# Patient Record
Sex: Female | Born: 1955 | Hispanic: No | Smoking: Never smoker
Health system: Southern US, Community
[De-identification: ages and names within clinical notes are randomized; demographics above are authoritative.]

## PROBLEM LIST (undated history)

## (undated) DIAGNOSIS — I10 Essential (primary) hypertension: Secondary | ICD-10-CM

## (undated) DIAGNOSIS — E119 Type 2 diabetes mellitus without complications: Secondary | ICD-10-CM

## (undated) HISTORY — DX: Essential (primary) hypertension: I10

## (undated) HISTORY — DX: Type 2 diabetes mellitus without complications: E11.9

## (undated) HISTORY — PX: NO PAST SURGERIES: SHX2092

---

## 1999-08-31 ENCOUNTER — Encounter: Admission: RE | Admit: 1999-08-31 | Discharge: 1999-11-29 | Payer: Self-pay | Admitting: *Deleted

## 2013-02-28 DIAGNOSIS — E119 Type 2 diabetes mellitus without complications: Secondary | ICD-10-CM | POA: Insufficient documentation

## 2013-07-31 DIAGNOSIS — R2230 Localized swelling, mass and lump, unspecified upper limb: Secondary | ICD-10-CM | POA: Insufficient documentation

## 2014-05-15 LAB — HM DIABETES EYE EXAM

## 2014-06-12 LAB — HEMOGLOBIN A1C: Hgb A1c MFr Bld: 9.7 % — AB (ref 4.0–6.0)

## 2014-08-26 ENCOUNTER — Ambulatory Visit (INDEPENDENT_AMBULATORY_CARE_PROVIDER_SITE_OTHER): Payer: BLUE CROSS/BLUE SHIELD | Admitting: Family Medicine

## 2014-08-26 VITALS — BP 151/70 | HR 73 | Ht 60.5 in | Wt 130.0 lb

## 2014-08-26 DIAGNOSIS — E119 Type 2 diabetes mellitus without complications: Secondary | ICD-10-CM

## 2014-08-26 DIAGNOSIS — I10 Essential (primary) hypertension: Secondary | ICD-10-CM | POA: Insufficient documentation

## 2014-08-26 DIAGNOSIS — Z0000 Encounter for general adult medical examination without abnormal findings: Secondary | ICD-10-CM

## 2014-08-26 LAB — COMPLETE METABOLIC PANEL WITH GFR
ALK PHOS: 72 U/L (ref 39–117)
ALT: 22 U/L (ref 0–35)
AST: 17 U/L (ref 0–37)
Albumin: 4.7 g/dL (ref 3.5–5.2)
BILIRUBIN TOTAL: 0.7 mg/dL (ref 0.2–1.2)
BUN: 12 mg/dL (ref 6–23)
CHLORIDE: 102 meq/L (ref 96–112)
CO2: 24 mEq/L (ref 19–32)
Calcium: 9.4 mg/dL (ref 8.4–10.5)
Creat: 0.55 mg/dL (ref 0.50–1.10)
GFR, Est African American: >89 mL/min
GFR, Est Non African American: >89 mL/min
GLUCOSE: 202 mg/dL — AB (ref 70–99)
Potassium: 4.5 mEq/L (ref 3.5–5.3)
Sodium: 138 mEq/L (ref 135–145)
Total Protein: 7.8 g/dL (ref 6.0–8.3)

## 2014-08-26 LAB — LIPID PANEL
Cholesterol: 193 mg/dL (ref 0–200)
HDL: 47 mg/dL (ref >39)
LDL CALC: 97 mg/dL (ref 0–99)
Total CHOL/HDL Ratio: 4.1 Ratio
Triglycerides: 244 mg/dL — ABNORMAL HIGH (ref <150)
VLDL: 49 mg/dL — ABNORMAL HIGH (ref 0–40)

## 2014-08-26 LAB — POCT GLYCOSYLATED HEMOGLOBIN (HGB A1C): Hemoglobin A1C: 10

## 2014-08-26 MED ORDER — DAPAGLIFLOZIN PRO-METFORMIN ER 5-1000 MG PO TB24: 1.0000 | ORAL_TABLET | Freq: Every day | ORAL | Status: DC

## 2014-08-26 MED ORDER — LOSARTAN POTASSIUM 50 MG PO TABS: 50.0000 mg | ORAL_TABLET | Freq: Every day | ORAL | Status: DC

## 2014-08-26 NOTE — Patient Instructions (Addendum)
Increase the metformin to twice a day. When you run out then can start the New pill for diabetes.

## 2014-08-26 NOTE — Progress Notes (Signed)
Subjective:    Patient ID: Cindy Parrish, female    DOB: 12-Mar-1956, 59 y.o.   MRN: [id number]  HPI 59 year old Hispanic female who is here with an interpreter today he comes in to establish care. She has a history of diabetes and hypertension. She normally takes metformin 1000 mg daily. She did not take any medications today but she is fasting. She really doesn't take her blood sugars on her glucometer because she has difficulty with using it. She did not bring it in with her here today. She also has a history of high blood pressure [employer] chlorothiazide for this. She was on lisinopril for a short period time and says that it caused vision changes. She stopped it and then restarted it again and it did the same thing so she feels very confident that it was causing the side effects. She does still take 12.5 g of hydrochlorothiazide currently. The interpreter was here with her today. She also has been eating more okra and celery and nopal and wants to make sure it's okay to eat these things.  She reports her last colonoscopy was in 2015 and last mammogram was in 2015. Last Pap smear was in 2014.   Review of Systems  Constitutional: Negative for fever, diaphoresis and unexpected weight change.  HENT: Negative for hearing loss, rhinorrhea and tinnitus.   Eyes: Negative for visual disturbance.  Respiratory: Negative for cough and wheezing.   Cardiovascular: Negative for chest pain and palpitations.  Gastrointestinal: Negative for nausea, vomiting, diarrhea and blood in stool.  Genitourinary: Negative for vaginal bleeding, vaginal discharge and difficulty urinating.  Musculoskeletal: Negative for myalgias and arthralgias.  Skin: Negative for rash.  Neurological: Negative for headaches.  Hematological: Negative for adenopathy. Does not bruise/bleed easily.  Psychiatric/Behavioral: Negative for sleep disturbance and dysphoric mood. The patient is not nervous/anxious.    BP 151/70 mmHg   Pulse 73  Ht 5' 0.5" (1.537 m)  Wt 130 lb (58.968 kg)  BMI 24.96 kg/m2  SpO2 98%    Allergies  Allergen Reactions  . Penicillins Rash    Only in IM form  . Lisinopril Other (See Comments)    vision changes    No past medical history on file.  Past Surgical History  Procedure Laterality Date  . No past surgeries      History   Social History  . Marital Status: Divorced    Spouse Name: N/A  . Number of Children: N/A  . Years of Education: N/A   Occupational History  . Not on file.   Social History Main Topics  . Smoking status: Never Smoker   . Smokeless tobacco: Not on file  . Alcohol Use: No  . Drug Use: No  . Sexual Activity: Not Currently   Other Topics Concern  . Not on file   Social History Narrative   2 coffees per day.  Walks for 1/2 hour 4 days per week.     Family History  Problem Relation Age of Onset  . Diabetes Mother     Outpatient Encounter Prescriptions as of 08/26/2014  Medication Sig  . Dapagliflozin-Metformin HCl ER 11-998 MG TB24 Take 1 tablet by mouth daily.  . hydrochlorothiazide (MICROZIDE) 12.5 MG capsule Take 1 capsule by mouth daily.  Marland Kitchen. losartan (COZAAR) 50 MG tablet Take 1 tablet (50 mg total) by mouth daily.  . [DISCONTINUED] metFORMIN (GLUCOPHAGE) 1000 MG tablet Take 1 tablet by mouth daily. Take 1 tablet by mouth every day with meals  Objective:   Physical Exam  Constitutional: She is oriented to person, place, and time. She appears well-developed and well-nourished.  HENT:  Head: Normocephalic and atraumatic.  Eyes: Conjunctivae are normal. Pupils are equal, round, and reactive to light.  Neck: Neck supple. No thyromegaly present.  Cardiovascular: Normal rate, regular rhythm and normal heart sounds.   No carotid brutis.   Pulmonary/Chest: Effort normal and breath sounds normal.  Lymphadenopathy:    She has no cervical adenopathy.  Neurological: She is alert and oriented to person, place, and time.  Skin:  Skin is warm and dry.  Psychiatric: She has a normal mood and affect. Her behavior is normal.          Assessment & Plan:  DM- Uncontrolled.  A1C is 10.  Discussed options. Will stop metformin and do Xigduo instead which has metformin artery and it. One about potential side effects including increased risk for UTIs and used infections. If she has any problems before her next one and please call the office back and let us know. Otherwise I think she'll do well with it. Also discussed the increased urination that occurs especially at the beginning of taking it. Will try Lisinpril caused vision changes.  Will try another ACE.  We can also consider insulin at some point bad like to start with something that may help her lose a little bit of weight. Follow-up in 3 months.  HTN - Uncontrolled.  Will add benazepril.  Continue hydrochlorothiazide. Follow up in 3 months.  She is hoping to get her physical scheduled since I would like to go ahead and get a lab slip for blood work. This was provided today and encouraged her to go fasting.

## 2014-08-27 LAB — TSH: TSH: 1.644 u[IU]/mL (ref 0.350–4.500)

## 2014-08-28 ENCOUNTER — Encounter: Payer: Self-pay | Admitting: Family Medicine

## 2014-09-20 ENCOUNTER — Telehealth: Payer: Self-pay

## 2014-09-20 NOTE — Telephone Encounter (Signed)
Follow up note from last lab results. Clydene's daughter, Gabriel RainwaterJaneth, states the losartan is causing her to have dizziness. She is ok with switching to a different ARB.

## 2014-09-23 ENCOUNTER — Other Ambulatory Visit: Payer: Self-pay

## 2014-09-23 MED ORDER — VALSARTAN 160 MG PO TABS: 160.0000 mg | ORAL_TABLET | Freq: Every day | ORAL | Status: DC

## 2014-09-23 MED ORDER — DAPAGLIFLOZIN PRO-METFORMIN ER 5-1000 MG PO TB24: 5.0000 mg | ORAL_TABLET | Freq: Once | ORAL | Status: DC

## 2014-09-23 NOTE — Telephone Encounter (Signed)
Ne script sent for valsartan to [facility].  Try for at least 3 weeks adn see how she dos on it.

## 2014-09-23 NOTE — Telephone Encounter (Signed)
Called Cindy HesselbachMaria to inform her of new script sent to pharmacy. - CF

## 2014-10-01 ENCOUNTER — Encounter: Payer: Self-pay | Admitting: Family Medicine

## 2014-11-25 ENCOUNTER — Ambulatory Visit: Payer: BLUE CROSS/BLUE SHIELD | Admitting: Family Medicine

## 2014-11-28 ENCOUNTER — Telehealth: Payer: Self-pay

## 2014-11-28 MED ORDER — METFORMIN HCL 1000 MG PO TABS: 1000.0000 mg | ORAL_TABLET | Freq: Two times a day (BID) | ORAL | Status: DC

## 2014-11-28 MED ORDER — EMPAGLIFLOZIN 25 MG PO TABS: 25.0000 mg | ORAL_TABLET | Freq: Every day | ORAL | Status: DC

## 2014-11-28 NOTE — Telephone Encounter (Signed)
Left detailed message.   

## 2014-11-28 NOTE — Telephone Encounter (Signed)
Patient cannot take the Marcelline DeistFarxiga because it upsets her stomach. She would like Metformin only. Please advise.

## 2014-11-28 NOTE — Telephone Encounter (Signed)
Metformin only will not control her blood sugars. She will have to take another medication. It certainly does not have to be [place]xiga if that's causing some side effects but we have to choose something.  We can try Jardiance with th metformin.

## 2014-11-29 NOTE — Telephone Encounter (Signed)
Daughter advised.

## 2014-12-10 ENCOUNTER — Ambulatory Visit: Payer: BLUE CROSS/BLUE SHIELD | Admitting: Family Medicine

## 2014-12-20 ENCOUNTER — Ambulatory Visit (INDEPENDENT_AMBULATORY_CARE_PROVIDER_SITE_OTHER): Payer: BLUE CROSS/BLUE SHIELD | Admitting: Family Medicine

## 2014-12-20 ENCOUNTER — Encounter: Payer: Self-pay | Admitting: Family Medicine

## 2014-12-20 VITALS — BP 140/80 | HR 75 | Ht 61.0 in | Wt 129.0 lb

## 2014-12-20 DIAGNOSIS — E119 Type 2 diabetes mellitus without complications: Secondary | ICD-10-CM

## 2014-12-20 DIAGNOSIS — I10 Essential (primary) hypertension: Secondary | ICD-10-CM | POA: Diagnosis not present

## 2014-12-20 DIAGNOSIS — E781 Pure hyperglyceridemia: Secondary | ICD-10-CM

## 2014-12-20 LAB — POCT UA - MICROALBUMIN
Albumin/Creatinine Ratio, Urine, POC: <30
Creatinine, POC: 100 mg/dL
MICROALBUMIN (UR) POC: 10 mg/L

## 2014-12-20 LAB — POCT GLYCOSYLATED HEMOGLOBIN (HGB A1C): Hemoglobin A1C: 8.4

## 2014-12-20 MED ORDER — METOPROLOL SUCCINATE ER 25 MG PO TB24: 25.0000 mg | ORAL_TABLET | Freq: Every day | ORAL | Status: DC

## 2014-12-20 MED ORDER — HYDROCHLOROTHIAZIDE 12.5 MG PO CAPS: 12.5000 mg | ORAL_CAPSULE | Freq: Every day | ORAL | Status: DC

## 2014-12-20 MED ORDER — DAPAGLIFLOZIN PROPANEDIOL 5 MG PO TABS: 5.0000 mg | ORAL_TABLET | Freq: Every day | ORAL | Status: DC

## 2014-12-20 NOTE — Progress Notes (Signed)
   Subjective:    Patient ID: Cindy Parrish, female    DOB: 1956-05-15, 59 y.o.   MRN: [id number]  HPI Diabetes - no hypoglycemic events. No wounds or sores that are not healing well. No increased thirst or urination. Checking glucose at home. Taking medications as prescribed without any side effects. She stopped the jardiance about 20 days ago bc started burning her stomach immediately after taking it.   Hypertension- We added benazepril to her regimen 3 mo ago.  Pt denies chest pain, SOB, dizziness, or heart palpitations.  Taking meds as directed w/o problems.  Denies medication side effects.    Review of Systems     Objective:   Physical Exam  Constitutional: She is oriented to person, place, and time. She appears well-developed and well-nourished.  HENT:  Head: Normocephalic and atraumatic.  Cardiovascular: Normal rate, regular rhythm and normal heart sounds.   Pulmonary/Chest: Effort normal and breath sounds normal.  Neurological: She is alert and oriented to person, place, and time.  Skin: Skin is warm and dry.  Psychiatric: She has a normal mood and affect. Her behavior is normal.          Assessment & Plan:  Diabetes-uncontrolled, but improved. A1C today is 8.4.  She has tried walking regularly which helps and I think the park sego made a big difference in her blood sugars as well.. Last A1c 3 months ago was 10.0. Will change to jardiance, due to intolerance to for cecal. Added to her intolerance list..  Due for foot exam and urine micro. Patient reports eye exam is UTD. Will call for report.    Hypertension - will try metoprolol since doesn't seem to tolerate the ACE and ARB class - all cause blurry visoin.  Added to intolerance list. Will start metoprolol. F/U in 3 mo.    Hypertriglyceridemia-she really needs to be started on a statin especially with her diagnosis of diabetes. Because she has had multiple medication side effects I really want to find something that's going  to work for her diabetes first that she tolerates before adding a statin so it doesn't complicate which medication is causing which side effects.

## 2014-12-20 NOTE — Addendum Note (Signed)
Addended by: Nani GasserMETHENEY, CATHERINE D on: 12/20/2014 12:28 PM   Modules accepted: Orders

## 2014-12-26 ENCOUNTER — Encounter: Payer: Self-pay | Admitting: Family Medicine

## 2015-03-22 ENCOUNTER — Other Ambulatory Visit: Payer: Self-pay | Admitting: Family Medicine

## 2015-03-28 ENCOUNTER — Ambulatory Visit (INDEPENDENT_AMBULATORY_CARE_PROVIDER_SITE_OTHER): Payer: BLUE CROSS/BLUE SHIELD | Admitting: Family Medicine

## 2015-03-28 ENCOUNTER — Encounter: Payer: Self-pay | Admitting: Family Medicine

## 2015-03-28 VITALS — BP 159/71 | HR 70 | Ht 61.0 in | Wt 129.0 lb

## 2015-03-28 DIAGNOSIS — E119 Type 2 diabetes mellitus without complications: Secondary | ICD-10-CM | POA: Diagnosis not present

## 2015-03-28 DIAGNOSIS — I10 Essential (primary) hypertension: Secondary | ICD-10-CM | POA: Diagnosis not present

## 2015-03-28 DIAGNOSIS — Z1159 Encounter for screening for other viral diseases: Secondary | ICD-10-CM

## 2015-03-28 LAB — POCT GLYCOSYLATED HEMOGLOBIN (HGB A1C): HEMOGLOBIN A1C: 9.4

## 2015-03-28 MED ORDER — GLIPIZIDE ER 5 MG PO TB24: 5.0000 mg | ORAL_TABLET | Freq: Every day | ORAL | Status: DC

## 2015-03-28 NOTE — Progress Notes (Signed)
   Subjective:    Patient ID: Cindy Parrish, female    DOB: Aug 26, 1955, 59 y.o.   MRN: [id number]  HPI Hypertension- Pt denies chest pain, SOB, dizziness, or heart palpitations.  Taking meds as directed w/o problems.  Denies medication side effects.  Says she is not sure if she is taking HCTZ or the metoprolol.    Diabetes - no hypoglycemic events. No wounds or sores that are not healing well. No increased thirst or urination. Checking glucose at home. Taking medications as prescribed without any side effects.  She stopped the [place] because after 3 days it gave her a headache.  She stopped it so hasn't been taking it. She is taking the metformin.  She also hasn't been able to walk for exercise like she was previously as her car broke down and she's not able to get to the park that she usually walks that.  She was seen today with an interpreter.  Review of Systems     Objective:   Physical Exam  Constitutional: She is oriented to person, place, and time. She appears well-developed and well-nourished.  HENT:  Head: Normocephalic and atraumatic.  Cardiovascular: Normal rate, regular rhythm and normal heart sounds.   Pulmonary/Chest: Effort normal and breath sounds normal.  Neurological: She is alert and oriented to person, place, and time.  Skin: Skin is warm and dry.  Psychiatric: She has a normal mood and affect. Her behavior is normal.          Assessment & Plan:  HTN - uncontrolled but she actually didn't take her medication this morning. She says it upsets her stomach if she doesn't take it with food and she was trying to fast. She is also not sure she's actually taking the HCTZ so I asked her to verify this when she gets home and compare with her after visit summary checkout list.  DM - uncontrolled. A1C is now 9.4.  Discussed options. Will start glipizide. Monitor for lows.  Will start with  ER.  F/U in 3 months. May need ot increase dose or add insulin at next OV. If can  start walking atai that will help.   Discussed getting the flu shot.   Discussed need for pneumococcal 23 vaccine. She said she wants to think about is a handout provided in [place].  She did agree to hepatitis C screening.

## 2015-03-29 LAB — BASIC METABOLIC PANEL WITH GFR
BUN: 10 mg/dL (ref 7–25)
CHLORIDE: 98 mmol/L (ref 98–110)
CO2: 25 mmol/L (ref 20–31)
CREATININE: 0.57 mg/dL (ref 0.50–0.99)
Calcium: 9.3 mg/dL (ref 8.6–10.4)
GFR, Est African American: >89 mL/min (ref >=60)
GFR, Est Non African American: >89 mL/min (ref >=60)
GLUCOSE: 207 mg/dL — AB (ref 65–99)
POTASSIUM: 4.6 mmol/L (ref 3.5–5.3)
Sodium: 137 mmol/L (ref 135–146)

## 2015-03-29 LAB — HEPATITIS C ANTIBODY: HCV Ab: NEGATIVE

## 2015-06-23 ENCOUNTER — Other Ambulatory Visit: Payer: Self-pay | Admitting: Family Medicine

## 2015-06-23 DIAGNOSIS — Z1231 Encounter for screening mammogram for malignant neoplasm of breast: Secondary | ICD-10-CM

## 2015-06-27 ENCOUNTER — Encounter: Payer: Self-pay | Admitting: Family Medicine

## 2015-06-27 ENCOUNTER — Ambulatory Visit: Payer: BLUE CROSS/BLUE SHIELD | Admitting: Family Medicine

## 2015-06-27 ENCOUNTER — Ambulatory Visit (INDEPENDENT_AMBULATORY_CARE_PROVIDER_SITE_OTHER): Payer: BLUE CROSS/BLUE SHIELD | Admitting: Family Medicine

## 2015-06-27 ENCOUNTER — Other Ambulatory Visit (HOSPITAL_COMMUNITY)
Admission: RE | Admit: 2015-06-27 | Discharge: 2015-06-27 | Disposition: A | Discharge status: Home / Self Care | Payer: BLUE CROSS/BLUE SHIELD | Source: Ambulatory Visit | Attending: Family Medicine | Admitting: Family Medicine

## 2015-06-27 VITALS — BP 158/74 | HR 75 | Wt 128.0 lb

## 2015-06-27 DIAGNOSIS — M546 Pain in thoracic spine: Secondary | ICD-10-CM | POA: Diagnosis not present

## 2015-06-27 DIAGNOSIS — E119 Type 2 diabetes mellitus without complications: Secondary | ICD-10-CM | POA: Diagnosis not present

## 2015-06-27 DIAGNOSIS — M549 Dorsalgia, unspecified: Secondary | ICD-10-CM

## 2015-06-27 DIAGNOSIS — Z0000 Encounter for general adult medical examination without abnormal findings: Secondary | ICD-10-CM

## 2015-06-27 DIAGNOSIS — I10 Essential (primary) hypertension: Secondary | ICD-10-CM

## 2015-06-27 DIAGNOSIS — Z1151 Encounter for screening for human papillomavirus (HPV): Secondary | ICD-10-CM | POA: Diagnosis not present

## 2015-06-27 DIAGNOSIS — Z23 Encounter for immunization: Secondary | ICD-10-CM | POA: Diagnosis not present

## 2015-06-27 DIAGNOSIS — Z01419 Encounter for gynecological examination (general) (routine) without abnormal findings: Secondary | ICD-10-CM | POA: Insufficient documentation

## 2015-06-27 DIAGNOSIS — R1011 Right upper quadrant pain: Secondary | ICD-10-CM

## 2015-06-27 LAB — POCT GLYCOSYLATED HEMOGLOBIN (HGB A1C): Hemoglobin A1C: 10.5

## 2015-06-27 MED ORDER — METOPROLOL SUCCINATE ER 50 MG PO TB24: 50.0000 mg | ORAL_TABLET | Freq: Every day | ORAL | Status: DC

## 2015-06-27 NOTE — Progress Notes (Signed)
Subjective:     Cindy Parrish is a 59 y.o. female and is here for a comprehensive physical exam. The patient reports no problems.  Diabetes - no hypoglycemic events. No wounds or sores that are not healing well. No increased thirst or urination. Checking glucose at home. Taking medications as prescribed without any side effects.  She has not been walking.  She wants to try a cactus juice ad beet juice to lower her sugars   Hypertension- Pt denies chest pain, SOB, dizziness, or heart palpitations.  Stopped the medication for ahile bc thought it was causing body pain but restarted it yesterday.    chronic pain in her Upper on the right side near the scapula. She says it started right after she went through menopause. Says it keeps her from working.  She says it becomes so painful that it's hard to move her arm and work. Says when she tries her pain is more painful. She says she has a low level of pain constantly. She describes it as near her right shoulder blade, but it feels deep.  Worse with acitvity.  Better with rest. No old trauma or injury known. She's never had any additional workup such as x-rays etc.  She also has RUQ pain that started after menopause.  Says he feels weak when it happens.  No nausea or vomiting.  Pain usually lasts all day.  No constipation or diarrhea.  No fevers or chills or sweats. It doesn't seem to get worse with eating or not eating. It's always a low level of pain that's present but gets worse at times especially with activity. She did see a provider for it a couple of years ago and was told that it was benign. She's never had any other workup or imaging.   Social History   Social History  . Marital Status: Divorced    Spouse Name: N/A  . Number of Children: N/A  . Years of Education: N/A   Occupational History  . Not on file.   Social History Main Topics  . Smoking status: Never Smoker   . Smokeless tobacco: Not on file  . Alcohol Use: No  . Drug Use: No   . Sexual Activity: Not Currently   Other Topics Concern  . Not on file   Social History Narrative   2 coffees per day.  Walks for 1/2 hour 4 days per week.    Health Maintenance  Topic Date Due  . HIV Screening  03/05/1970  . TETANUS/TDAP  03/05/1974  . ZOSTAVAX  03/06/2015  . OPHTHALMOLOGY EXAM  05/16/2015  . INFLUENZA VACCINE  03/27/2016 (Originally 02/17/2015)  . PNEUMOCOCCAL POLYSACCHARIDE VACCINE (1) 03/27/2021 (Originally 03/05/1957)  . PAP SMEAR  07/20/2015  . HEMOGLOBIN A1C  09/25/2015  . FOOT EXAM  12/20/2015  . URINE MICROALBUMIN  12/20/2015  . MAMMOGRAM  07/10/2016  . COLONOSCOPY  07/20/2023  . Hepatitis C Screening  Completed    The following portions of the patient's history were reviewed and updated as appropriate: allergies, current medications, past family history, past medical history, past social history, past surgical history and problem list.  Review of Systems Pertinent items noted in HPI and remainder of comprehensive ROS otherwise negative.   Objective:    BP 158/74 mmHg  Pulse 75  Wt 128 lb (58.06 kg)  SpO2 99% General appearance: alert, cooperative and appears stated age Head: Normocephalic, without obvious abnormality, atraumatic Eyes: conj clear, EOMI, PEERLA Ears: normal TM's and external ear canals both  ears Nose: Nares normal. Septum midline. Mucosa normal. No drainage or sinus tenderness. Throat: lips, mucosa, and tongue normal; teeth and gums normal Neck: no adenopathy, no carotid bruit, no JVD, supple, symmetrical, trachea midline and thyroid not enlarged, symmetric, no tenderness/mass/nodules Back: symmetric, no curvature. ROM normal. No CVA tenderness. Lungs: clear to auscultation bilaterally Breasts: normal appearance, no masses or tenderness Heart: regular rate and rhythm, S1, S2 normal, no murmur, click, rub or gallop Abdomen: soft, non-tender; bowel sounds normal; no masses,  no organomegaly Pelvic: cervix normal in appearance,  external genitalia normal, no adnexal masses or tenderness, no cervical motion tenderness, rectovaginal septum normal, uterus normal size, shape, and consistency and vagina normal without discharge Extremities: extremities normal, atraumatic, no cyanosis or edema Pulses: 2+ and symmetric Skin: Skin color, texture, turgor normal. No rashes or lesions Lymph nodes: Cervical, supraclavicular, and axillary nodes normal. Neurologic: Alert and oriented X 3, normal strength and tone. Normal symmetric reflexes. Normal coordination and gait    Assessment:    Healthy female exam.      Plan:     See After Visit Summary for Counseling Recommendations   Keep up a regular exercise program and make sure you are eating a healthy diet Try to eat 4 servings of dairy a day, or if you are lactose intolerant take a calcium with vitamin D daily.  Your vaccines are up to date.   DM - Uncontrolled.  I strongly encouraged her to consider taking an additional medication with the metformin. She has tried several but unfortunately has had side effects. She really was resistant and wants to try a juice that is made from cactus plant as well as beats. She also admits she has not been exercising and wants to get back on track with that before we add another medication. I reviewed the long-term consequences of having uncontrolled blood sugars and the side effects down the road. She wants to try this for 3 months area explained that if at 3 months her blood sugars are still not well controlled then she needs to commit to trying another medication.. Intoleratnt to ACE.    Due for Tdap as well. Given today. Pneumococcal 23 vaccine given today as well.  Lab Results  Component Value Date   HGBA1C 10.5 06/27/2015    HTN - uncontrolled. She just restarted her blood pressure medication yesterday as she thought it was causing some body aches. But says she's continued to have the bodyaches even off of the medication so she  decided to restart it. Encouraged her to be consistent and hopefully her blood pressure will look more well controlled when I see her back. I did go ahead and increase the metoprolol to 50 mg as well and sent a new prescription to the pharmacy.  Chronic right upper back pain for several years-she's really never had a formal workup or evaluation. She had some mild tenderness along the anterior edge of the scapula. The feels like her pain is a little deeper. Offer to do x-rays. She wants to check with the insurance coverage first. It is significant enough that it makes it difficult for her to work.  Right upper quadrant pain-she has seen a physician for this before and was told her that it was benign. Her pain is persistent. She does not take pain medications for it. But this is been going on for several ears I think a CT of the abdomen and is warranted. Again she wants to check with the insurance first.  I think it or something very worrisome it would've gotten worse by now. But certainly do think it requires additional workup.

## 2015-06-27 NOTE — Patient Instructions (Addendum)
Keep up a regular exercise program and make sure you are eating a healthy diet Try to eat 4 servings of dairy a day, or if you are lactose intolerant take a calcium with vitamin D daily.  Your vaccines are up to date.    4 year right upper back pain I would recommend an x-ray of the thoracic spine as well as an x-ray of your right shoulder. For the right upper quadrant abdominal pain I would recommend CT of the abdomen with contrast for further evaluation since this is been going on for several years. I also recommend that you get an up-to-date yearly eye exam because of your diagnosis of diabetes.

## 2015-06-30 LAB — CYTOLOGY - PAP

## 2015-07-01 LAB — HM DIABETES EYE EXAM

## 2015-07-01 NOTE — Progress Notes (Signed)
Quick Note:  Call patient: Your Pap smear is normal. Repeat in 5 years. ______ 

## 2015-07-02 ENCOUNTER — Encounter: Payer: Self-pay | Admitting: Family Medicine

## 2015-07-16 ENCOUNTER — Ambulatory Visit (INDEPENDENT_AMBULATORY_CARE_PROVIDER_SITE_OTHER): Payer: BLUE CROSS/BLUE SHIELD

## 2015-07-16 DIAGNOSIS — Z1231 Encounter for screening mammogram for malignant neoplasm of breast: Secondary | ICD-10-CM | POA: Diagnosis not present

## 2015-07-31 ENCOUNTER — Ambulatory Visit (INDEPENDENT_AMBULATORY_CARE_PROVIDER_SITE_OTHER): Payer: BLUE CROSS/BLUE SHIELD | Admitting: Osteopathic Medicine

## 2015-07-31 ENCOUNTER — Encounter: Payer: Self-pay | Admitting: Osteopathic Medicine

## 2015-07-31 VITALS — BP 154/69 | HR 90 | Temp 98.0°F | Ht 61.0 in

## 2015-07-31 DIAGNOSIS — J029 Acute pharyngitis, unspecified: Secondary | ICD-10-CM | POA: Diagnosis not present

## 2015-07-31 LAB — POCT RAPID STREP A (OFFICE): RAPID STREP A SCREEN: NEGATIVE

## 2015-07-31 MED ORDER — LIDOCAINE VISCOUS 2 % MT SOLN
OROMUCOSAL | Status: DC
Start: 2015-07-31 — End: 2015-08-29

## 2015-07-31 NOTE — Progress Notes (Signed)
HPI: Cindy Parrish is a 60 y.o. female who presents to [facility]  today for chief complaint of:  Chief Complaint  Patient presents with  . Cough    . Location: chest, throat . Quality: cough, sore throat worse on the left . Severity: marked . Duration: 2 days . Modifying factors: has tried the following OTC medications: herbal tea  without relief . Assoc signs/symptoms: no fever/chills, occasional productive cough, Yes  body aches, No  GI upset, no hoarseness, is able to swallow liquids, gargle salt water, hurts to swallow solid foods or pills so she hasn't taken BP meds today, BP mild elevated no CP/SOB. No regurgitation, no nausea.   Interpreter assists with history   Past medical, social and family history reviewed: No past medical history on file. Past Surgical History  Procedure Laterality Date  . No past surgeries     Social History  Substance Use Topics  . Smoking status: Never Smoker   . Smokeless tobacco: Not on file  . Alcohol Use: No   Family History  Problem Relation Age of Onset  . Diabetes Mother     Current Outpatient Prescriptions  Medication Sig Dispense Refill  . hydrochlorothiazide (MICROZIDE) 12.5 MG capsule Take 1 capsule (12.5 mg total) by mouth daily. 90 capsule 1  . metFORMIN (GLUCOPHAGE) 1000 MG tablet TAKE 1 TABLET (1,000 MG TOTAL) BY MOUTH 2 (TWO) TIMES DAILY WITH A MEAL. 180 tablet 0  . metoprolol succinate (TOPROL-XL) 50 MG 24 hr tablet Take 1 tablet (50 mg total) by mouth daily. 90 tablet 1   No current facility-administered medications for this visit.   Allergies  Allergen Reactions  . Penicillins Rash    Only in IM form  . Empagliflozin Other (See Comments)    Burning sensation in stomach  . Marcelline Deist [Dapagliflozin] Other (See Comments)    Headache  . Lisinopril Other (See Comments)    vision changes  . Losartan Other (See Comments)    Dizzniess  . Valsartan Other (See Comments)    Blurry  vision.       Review of Systems: CONSTITUTIONAL: no fever/chills HEAD/EYES/EARS/NOSE/THROAT: mild headache, no vision change or hearing change, yes sore throat CARDIAC: No chest pain/pressure/palpitations, no orthopnea RESPIRATORY: occasional cough, no shortness of breath GASTROINTESTINAL: no nausea, no vomiting, no abdominal pain/blood in stool/diarrhea/constipation MUSCULOSKELETAL: mild myalgia/arthralgia   Exam:  BP 154/69 mmHg  Pulse 90  Temp(Src) 98 F (36.7 C) (Oral)  Ht 5\' 1"  (1.549 m) Constitutional: VSS, see above. General Appearance: alert, well-developed, well-nourished, NAD Eyes: Normal lids and conjunctive, non-icteric sclera, PERRLA Ears, Nose, Mouth, Throat: Normal external inspection ears/nares/mouth/lips/gums, normal TM, MMM; posterior pharynx with erythema, without exudate, no trisums. Mallampati 3-4 limits exam even with tongue depressor Neck: No masses, trachea midline. No thyroid enlargement or nodule appreciated, normal lymph nodes in zise and texture however pt reoirts some tenderness on L LN, normal neck range of motion Respiratory: Normal respiratory effort. No  wheeze/rhonchi/rales Cardiovascular: S1/S2 normal, no murmur/rub/gallop auscultated. RRR. No carotid bruit or JVD. No lower extremity edema.  MODIFIED CENTOR CRITERIA (ponts if "yes"): Tonsillar exudate (1): no Tender Ant Cervical LN (1): yes Absence of cough (1): no Fever (1): no Age  11-14 (1): no 15-45 (0): yes >/= 45 (-1): no SCORE: 1   ASSESSMENT/PLAN: Advised Lidocaine and OTC meds for sore throat. ER precautions reviewed: pain with neck movement, change in voice, severe pain, other concerns. RTC 1 week i fno better, sooner if  worse. Pt info printed in Spanish.   Sore throat - Plan: lidocaine (XYLOCAINE) 2 % solution, POCT rapid strep A, Culture, Group A Strep    Return if symptoms worsen or fail to improve.

## 2015-07-31 NOTE — Patient Instructions (Signed)
Faringitis  (Pharyngitis)  La faringitis ocurre cuando la faringe presenta enrojecimiento, dolor e hinchazón (inflamación).   CAUSAS   Normalmente, la faringitis se debe a una infección. Generalmente, estas infecciones ocurren debido a virus (viral) y se presentan cuando las personas se resfrían. Sin embargo, a veces la faringitis es provocada por bacterias (bacteriana). Las alergias también pueden ser una causa de la faringitis. La faringitis viral se puede contagiar de una persona a otra al toser, estornudar y compartir objetos o utensilios personales (tazas, tenedores, cucharas, cepillos de diente). La faringitis bacteriana se puede contagiar de una persona a otra a través de un contacto más íntimo, como besar.   SIGNOS Y SÍNTOMAS   Los síntomas de la faringitis incluyen los siguientes:   · Dolor de garganta.  · Cansancio (fatiga).  · Fiebre no muy elevada.  · Dolor de cabeza.  · Dolores musculares y en las articulaciones.  · Erupciones cutáneas  · Ganglios linfáticos hinchados.  · Una película parecida a las placas en la garganta o las amígdalas (frecuente con la faringitis bacteriana).  DIAGNÓSTICO   El médico le hará preguntas sobre la enfermedad y sus síntomas. Normalmente, todo lo que se necesita para diagnosticar una faringitis son sus antecedentes médicos y un examen físico. A veces se realiza una prueba rápida para estreptococos. También es posible que se realicen otros análisis de laboratorio, según la posible causa.   TRATAMIENTO   La faringitis viral normalmente mejorará en un plazo de 3 a 4 días sin medicamentos. La faringitis bacteriana se trata con medicamentos que matan los gérmenes (antibióticos).   INSTRUCCIONES PARA EL CUIDADO EN EL HOGAR   · Beba gran cantidad de líquido para mantener la orina de tono claro o color amarillo pálido.  · Tome solo medicamentos de venta libre o recetados, según las indicaciones del médico.    Si le receta antibióticos, asegúrese de terminarlos, incluso si comienza  a sentirse mejor.    No tome aspirina.  · Descanse lo suficiente.  · Hágase gárgaras con 8 onzas (227 ml) de agua con sal (½ cucharadita de sal por litro de agua) cada 1 o 2 horas para calmar la garganta.  · Puede usar pastillas (si no corre riesgo de ahogarse) o aerosoles para calmar la garganta.  SOLICITE ATENCIÓN MÉDICA SI:   · Tiene bultos grandes y dolorosos en el cuello.  · Tiene una erupción cutánea.  · Cuando tose elimina una expectoración verde, amarillo amarronado o con sangre.  SOLICITE ATENCIÓN MÉDICA DE INMEDIATO SI:   · El cuello se pone rígido.  · Comienza a babear o no puede tragar líquidos.  · Vomita o no puede retener los medicamentos ni los líquidos.  · Siente un dolor intenso que no se alivia con los medicamentos recomendados.  · Tiene dificultades para respirar (y no debido a la nariz tapada).  ASEGÚRESE DE QUE:   · Comprende estas instrucciones.  · Controlará su afección.  · Recibirá ayuda de inmediato si no mejora o si empeora.     Esta información no tiene como fin reemplazar el consejo del médico. Asegúrese de hacerle al médico cualquier pregunta que tenga.     Document Released: 04/14/2005 Document Revised: 04/25/2013  Elsevier Interactive Patient Education ©2016 Elsevier Inc.

## 2015-08-02 LAB — CULTURE, GROUP A STREP: Organism ID, Bacteria: NORMAL

## 2015-08-16 ENCOUNTER — Other Ambulatory Visit: Payer: Self-pay | Admitting: Family Medicine

## 2015-08-28 ENCOUNTER — Ambulatory Visit: Payer: BLUE CROSS/BLUE SHIELD | Admitting: Osteopathic Medicine

## 2015-08-29 ENCOUNTER — Encounter: Payer: Self-pay | Admitting: Physician Assistant

## 2015-08-29 ENCOUNTER — Ambulatory Visit (INDEPENDENT_AMBULATORY_CARE_PROVIDER_SITE_OTHER): Payer: BLUE CROSS/BLUE SHIELD | Admitting: Physician Assistant

## 2015-08-29 VITALS — BP 151/48 | HR 70 | Ht 61.0 in | Wt 130.0 lb

## 2015-08-29 DIAGNOSIS — IMO0002 Reserved for concepts with insufficient information to code with codable children: Secondary | ICD-10-CM | POA: Insufficient documentation

## 2015-08-29 DIAGNOSIS — N8110 Cystocele, unspecified: Secondary | ICD-10-CM | POA: Diagnosis not present

## 2015-08-29 NOTE — Addendum Note (Signed)
Addended by: Jomarie Longs on: 08/29/2015 11:46 AM   Modules accepted: Orders

## 2015-08-29 NOTE — Patient Instructions (Addendum)
Reparacin del cistocele (Cystocele Repair) La reparacin del cistocele es un procedimiento quirrgico para extirpar un cistocele que es un bulto, una zona que cae (hernia) de la vejiga y que se extiende hacia la vagina. Este abultamiento o protrusin se produce en la pared anterior de la vagina. INFORME A SU MDICO:   Cualquier alergia que tenga.  Todos los [employer] [place], incluyendo vitaminas, hierbas, gotas oftlmicas, cremas y [address] de [address].  Uso de corticoides (por va oral o cremas).  Problemas previos que usted o los [employer] de su familia hayan tenido con el uso de anestsicos.  Enfermedades de [employer].  Cirugas previas.  Padecimientos mdicos.  Posibilidad de embarazo, si correspondiera. RIESGOS Y COMPLICACIONES  Generalmente, ste es un procedimiento seguro. Sin embargo, Tree surgeon procedimiento, pueden surgir complicaciones. Las complicaciones posibles son:  Sharlyne Pacas.  Infeccin.  Lesin en los rganos circundantes.  Problemas relacionados con la anestesia. Los riesgos podrn variar segn el tipo de anestesia administrada.  Problemas con el catter urinario despus de la ciruga, como una obstruccin.  Reaparicin del cistocele. ANTES DEL PROCEDIMIENTO   Consulte a su mdico si debe cambiar o suspender los medicamentos que toma habitualmente. Es posible que deba dejar de tomar ciertos medicamentos antes de la [place].  No coma ni beba nada despus de la medianoche anterior a la ciruga.  Si fuma, no lo haga al [employer] previas a la [place].  No beba alcohol los [address] a la [place].  Pdale a alguna persona que la lleve a su casa despus de la hospitalizacin y que la ayude con las actividades durante la recuperacin. PROCEDIMIENTO   Le aplicarn un medicamento que la har dormir durante el procedimiento (anestesia general) o le inyectarn un medicamento para adormecer la zona de la cintura  para abajo (anestesia espinal) o anestesia epidural. Usted dormir o tendr el cuerpo adormecido durante todo el procedimiento.  Le colocarn un tubo delgado y flexible (catter Foley) en la vejiga para drenar la orina durante y despus de la [place].  Esta ciruga se realiza a travs de la vagina. La pared anterior de la vagina se abre, y el msculo que se encuentra entre la vejiga y la vagina se vuelve hacia su posicin normal. Esto se refuerza con puntos o un trozo de [place]. De este modo se extirpa la hernia y la parte superior de la vagina no caer en la abertura de la misma.  El corte en la pared anterior de la vagina se cierra con puntos que se reabsorben y no necesitan quitarse. DESPUS DEL PROCEDIMIENTO   La llevarn al rea de recuperacin donde controlarn su evolucin de cerca. Le controlarn con frecuencia la respiracin, la presin arterial y el pulso (signos vitales). Cuando se encuentre estable, ser llevada a una habitacin comn del hospital.  Tendr colocado un catter para drenar la vejiga. Este se Database administrator 2 a 7 das o hasta que la vejiga funcione adecuadamente por s misma.  Tendr Neomia Dear gasa en la vagina. Esta se retirar en 1 o 2 das despus de la [place].  Le darn medicamentos para calmar el dolor segn sea necesario y podrn indicarle medicamentos para destruir los grmenes (antibiticos).  Ser necesario que Administrator, [facility] durante 1 - 2 das.   Esta informacin no tiene Theme park manager el consejo del mdico. Asegrese de hacerle al mdico cualquier pregunta que tenga.   Document Released: 07/05/2005 Document Revised: 04/25/2013 Elsevier  Interactive Patient Education [employer]

## 2015-08-29 NOTE — Progress Notes (Addendum)
   Subjective:    Patient ID: Cindy Parrish female    DOB: Dec 01, 1955, 60 y.o.   MRN: [id number]  HPI  Patient is a 60 year old female presenting with "a bump on her privates". Patient states that she first noticed her symptoms a year ago. Patient states that she feels pressure but does not feel pain. Patient states that her "bump" becomes more noticeable when she laughs and when she lifts heavy objects. Patient states that she feels "the bump come out" when she is in the shower. Patient denies increased urinary frequency or dysuria. Patient denies symptoms of urinary incontinence. Patient has been afebrile. She has had 3 children.   Review of Systems Please see HPI.     Objective:   Physical Exam  Constitutional: She is oriented to person, place, and time. She appears well-developed and well-nourished. No distress.  HENT:  Head: Normocephalic and atraumatic.  Eyes: Conjunctivae are normal. Pupils are equal, round, and reactive to light.  Cardiovascular: Normal rate and regular rhythm.   Pulmonary/Chest: Effort normal and breath sounds normal. No respiratory distress.  Abdominal: Soft. Bowel sounds are normal. She exhibits no distension and no mass. There is no tenderness. There is no rebound and no guarding.  Genitourinary:  bulge was not visible initially. Patient's bulge became visible with retraction of the labia with digits.after introducing speculum appear to push bulge posteriorly Patient's cervical os is still visible once speculum was introduced , decreasing index of suspicion for a prolapsed uterus.   Neurological: She is alert and oriented to person, place, and time.  Skin: Skin is warm. She is not diaphoretic.  Psychiatric: She has a normal mood and affect. Her behavior is normal. Judgment and thought content normal.          Assessment & Plan:  1. Cystocele- grade II. Patient's history and physical exam findings are consistent with a cystocele. Patient was referred to  gynecology. Patient education was provided regarding the etiology and symptoms associated with a cystocele.

## 2015-09-08 ENCOUNTER — Other Ambulatory Visit: Payer: Self-pay

## 2015-09-08 MED ORDER — METFORMIN HCL 1000 MG PO TABS: 1000.0000 mg | ORAL_TABLET | Freq: Every day | ORAL | Status: DC

## 2015-09-23 ENCOUNTER — Encounter: Payer: BLUE CROSS/BLUE SHIELD | Admitting: Obstetrics & Gynecology

## 2015-09-25 ENCOUNTER — Ambulatory Visit: Payer: BLUE CROSS/BLUE SHIELD | Admitting: Family Medicine

## 2015-10-07 ENCOUNTER — Ambulatory Visit (INDEPENDENT_AMBULATORY_CARE_PROVIDER_SITE_OTHER): Payer: BLUE CROSS/BLUE SHIELD | Admitting: Family Medicine

## 2015-10-07 ENCOUNTER — Encounter: Payer: Self-pay | Admitting: Family Medicine

## 2015-10-07 VITALS — BP 137/59 | HR 71 | Wt 128.0 lb

## 2015-10-07 DIAGNOSIS — Z114 Encounter for screening for human immunodeficiency virus [HIV]: Secondary | ICD-10-CM

## 2015-10-07 DIAGNOSIS — H9192 Unspecified hearing loss, left ear: Secondary | ICD-10-CM

## 2015-10-07 DIAGNOSIS — R1011 Right upper quadrant pain: Secondary | ICD-10-CM

## 2015-10-07 DIAGNOSIS — E119 Type 2 diabetes mellitus without complications: Secondary | ICD-10-CM

## 2015-10-07 DIAGNOSIS — I10 Essential (primary) hypertension: Secondary | ICD-10-CM

## 2015-10-07 DIAGNOSIS — Z13220 Encounter for screening for lipoid disorders: Secondary | ICD-10-CM | POA: Diagnosis not present

## 2015-10-07 DIAGNOSIS — H9312 Tinnitus, left ear: Secondary | ICD-10-CM

## 2015-10-07 LAB — POCT UA - MICROALBUMIN
Creatinine, POC: 200 mg/dL
Microalbumin Ur, POC: 30 mg/L

## 2015-10-07 LAB — POCT GLYCOSYLATED HEMOGLOBIN (HGB A1C): Hemoglobin A1C: 9.3

## 2015-10-07 MED ORDER — PREDNISONE 20 MG PO TABS: 40.0000 mg | ORAL_TABLET | Freq: Every day | ORAL | Status: DC

## 2015-10-07 MED ORDER — SITAGLIPTIN PHOSPHATE 100 MG PO TABS: 100.0000 mg | ORAL_TABLET | Freq: Every day | ORAL | Status: DC

## 2015-10-07 MED ORDER — METFORMIN HCL 1000 MG PO TABS: 1000.0000 mg | ORAL_TABLET | Freq: Two times a day (BID) | ORAL | Status: DC

## 2015-10-07 NOTE — Patient Instructions (Signed)
Increase metformin to twice a day. We are adding Januvia daily to the regimen as well for diabetes control. Continue to work on diet and exercise. Okay to take half a tab of the metoprolol twice a day. If not tolerating it after 2-3 weeks and please let me know and we can consider switching to amlodipine instead.

## 2015-10-07 NOTE — Progress Notes (Signed)
Subjective:    Patient ID: Cindy Parrish, female    DOB: 07-May-1956, 60 y.o.   MRN: [id number]  HPI Diabetes - no hypoglycemic events. No wounds or sores that are not healing well. No increased thirst or urination. Checking glucose at home. Taking medications as prescribed without any side effects.  Hypertension- Pt denies chest pain, SOB, dizziness, or heart palpitations.  She has been cutting her pill in half and only taking half a tab once a day. She feels like it was causing her extremities including her hands and feet felt really cold at times.  She is getting some ear ringing and popping in her ears. Noticed more since her husband passed away.  It started suddenly when she bent over her seeing while washing dishes. She's had some hearing loss with that as well. Some discomfort. No drainage or fever.    She's also had some intermittent right upper quadrant pain. Typically happens after eating. It lasts about 30 minutes. It occurs almost once a week. She wonders if it could just be gas. No nausea or vomiting with it. No bowel changes.  She also has a lump on the left shoulder that she would like me to look at today. She says it's occasionally tender but otherwise not bothersome. She noticed a couple months ago.   Review of Systems     Objective:   Physical Exam  Constitutional: She is oriented to person, place, and time. She appears well-developed and well-nourished.  HENT:  Head: Normocephalic and atraumatic.  Right Ear: External ear normal.  Left Ear: External ear normal.  Nose: Nose normal.  Mouth/Throat: Oropharynx is clear and moist.  TMs and canals are clear.   Eyes: Conjunctivae and EOM are normal. Pupils are equal, round, and reactive to light.  Neck: Neck supple. No thyromegaly present.  Cardiovascular: Normal rate, regular rhythm and normal heart sounds.   Pulmonary/Chest: Effort normal and breath sounds normal. She has no wheezes.  Abdominal: Soft. Bowel sounds are  normal. She exhibits no distension and no mass. There is tenderness. There is no rebound and no guarding.  Mild RUQ and RLQ pain.   Lymphadenopathy:    She has no cervical adenopathy.  Neurological: She is alert and oriented to person, place, and time.  Skin: Skin is warm and dry.  Psychiatric: She has a normal mood and affect. Her behavior is normal.   She does have a small round palpable lump over the top of trapezius on the left shoulder. Do not see any large pore in the center but does feel more consistent with a sebaceous cyst.       Assessment & Plan:  DM- Uncontrolled, A1c 9.3 today which is down from previous of 10.5. Will increase metformin to twice a day and add Januvia. Coupon card provided. Follow-up in 3 months. Continue work on diet and exercise.  HTN - Well controlled. Try to take teh metoprolol 1/2 tab BID. If she's not able to tolerate that then we might consider switching her to amlodipine since it has visit dilatory effects. Continue current regimen. Follow up in 3 months.    RUQ pain - possibly gas or stool moving through the flexure in the right upper quadrant. Explained that if it's happening more frequently, last longer, is actually more painful, or starts to get any nausea or vomiting with it and please let me know and we'll workup further with gallbladder ultrasound.  Tinnitus with acute hearing loss in the left ear-hearing test  performed. Will treat with 5 day course of oral prednisone. Tympanic membranes looks completely normal. She did have decreased hearing compared to the right. If she does not have resolution of symptoms after prednisone then we'll need to refer to ENT for further evaluation.  Lump near left shoulder-they've reassurance is most likely a sebaceous cyst-we could certainly get an ultrasound for further evaluation if she would like. It does not feel like a swollen lymph node at the little too firm to be a lipoma.

## 2015-10-08 ENCOUNTER — Encounter: Payer: BLUE CROSS/BLUE SHIELD | Admitting: Obstetrics & Gynecology

## 2015-10-08 LAB — COMPLETE METABOLIC PANEL WITH GFR
ALT: 27 U/L (ref 6–29)
AST: 21 U/L (ref 10–35)
Albumin: 4.5 g/dL (ref 3.6–5.1)
Alkaline Phosphatase: 68 U/L (ref 33–130)
BILIRUBIN TOTAL: 1.5 mg/dL — AB (ref 0.2–1.2)
BUN: 10 mg/dL (ref 7–25)
CO2: 26 mmol/L (ref 20–31)
CREATININE: 0.54 mg/dL (ref 0.50–0.99)
Calcium: 9.5 mg/dL (ref 8.6–10.4)
Chloride: 102 mmol/L (ref 98–110)
GLUCOSE: 174 mg/dL — AB (ref 65–99)
POTASSIUM: 4.4 mmol/L (ref 3.5–5.3)
Sodium: 138 mmol/L (ref 135–146)
TOTAL PROTEIN: 7.6 g/dL (ref 6.1–8.1)

## 2015-10-08 LAB — HIV ANTIBODY (ROUTINE TESTING W REFLEX): HIV: NONREACTIVE

## 2015-10-08 LAB — LIPID PANEL
Cholesterol: 210 mg/dL — ABNORMAL HIGH (ref 125–200)
HDL: 44 mg/dL — ABNORMAL LOW (ref >=46)
LDL CALC: 126 mg/dL (ref <130)
Total CHOL/HDL Ratio: 4.8 Ratio (ref <=5.0)
Triglycerides: 200 mg/dL — ABNORMAL HIGH (ref <150)
VLDL: 40 mg/dL — ABNORMAL HIGH (ref <30)

## 2015-10-08 LAB — TSH: TSH: 2.1 m[IU]/L

## 2015-10-13 ENCOUNTER — Telehealth: Payer: Self-pay | Admitting: Family Medicine

## 2015-10-13 DIAGNOSIS — H9319 Tinnitus, unspecified ear: Secondary | ICD-10-CM

## 2015-10-13 MED ORDER — METOPROLOL SUCCINATE ER 50 MG PO TB24: 50.0000 mg | ORAL_TABLET | Freq: Two times a day (BID) | ORAL | Status: DC

## 2015-10-13 NOTE — Telephone Encounter (Signed)
Rx sent was for 50mg  BID, last note appeared to be 25mg  BID. Is Rx OK?

## 2015-10-13 NOTE — Telephone Encounter (Signed)
Referral placed.

## 2015-10-13 NOTE — Telephone Encounter (Signed)
Ok to order ENT referral. I ordered her metoprolol.

## 2015-10-13 NOTE — Telephone Encounter (Signed)
Pt's daughter called and said the Prednisone did not help with the tinnitus and would like a referral at this time.  Also, Pt was advised to take 25mg  of Metoprolol BID, would like a new Rx for that strength. Please advise.

## 2015-10-15 ENCOUNTER — Ambulatory Visit (INDEPENDENT_AMBULATORY_CARE_PROVIDER_SITE_OTHER): Payer: BLUE CROSS/BLUE SHIELD | Admitting: Obstetrics & Gynecology

## 2015-10-15 ENCOUNTER — Encounter: Payer: Self-pay | Admitting: Obstetrics & Gynecology

## 2015-10-15 VITALS — BP 141/72 | HR 66 | Wt 130.0 lb

## 2015-10-15 DIAGNOSIS — N8110 Cystocele, unspecified: Secondary | ICD-10-CM

## 2015-10-15 DIAGNOSIS — IMO0002 Reserved for concepts with insufficient information to code with codable children: Secondary | ICD-10-CM

## 2015-10-15 NOTE — Progress Notes (Signed)
   Subjective:    Patient ID: Cindy Parrish, female    DOB: 07/26/1955, 60 y.o.   MRN: [id number]  HPI 60 yo SH P3, all NSVDs, here with the complaint of her bladder dropping since 1/17. She has occasional GSUI.   Review of Systems She is not sexually active. She reports a normal pap recently Homemaker    Objective:   Physical Exam  WNWHHFNAD Breathing, conversing, ambulating normally Grade 3 cystocele     Assessment & Plan:  Cystocele- I discussed surgery versus pessary She would like to try a pessary Schedule a pessary fitting

## 2015-10-27 ENCOUNTER — Telehealth: Payer: Self-pay

## 2015-10-27 MED ORDER — SITAGLIPTIN PHOSPHATE 100 MG PO TABS: 100.0000 mg | ORAL_TABLET | Freq: Every day | ORAL | Status: DC

## 2015-10-27 MED ORDER — METOPROLOL SUCCINATE ER 25 MG PO TB24: 25.0000 mg | ORAL_TABLET | Freq: Two times a day (BID) | ORAL | Status: AC

## 2015-10-27 NOTE — Telephone Encounter (Signed)
Left message for daughter regarding the medications.

## 2015-10-27 NOTE — Telephone Encounter (Signed)
Januvia changed to 90 day supply. Corrected the metoprolol dose to 25 mg twice a day.

## 2015-10-29 ENCOUNTER — Ambulatory Visit (INDEPENDENT_AMBULATORY_CARE_PROVIDER_SITE_OTHER): Payer: BLUE CROSS/BLUE SHIELD | Admitting: Obstetrics & Gynecology

## 2015-10-29 ENCOUNTER — Encounter: Payer: Self-pay | Admitting: Obstetrics & Gynecology

## 2015-10-29 VITALS — BP 140/80 | HR 88 | Resp 16 | Ht 60.0 in | Wt 130.0 lb

## 2015-10-29 DIAGNOSIS — IMO0002 Reserved for concepts with insufficient information to code with codable children: Secondary | ICD-10-CM

## 2015-10-29 DIAGNOSIS — N8110 Cystocele, unspecified: Secondary | ICD-10-CM | POA: Diagnosis not present

## 2015-10-29 NOTE — Progress Notes (Signed)
   Subjective:    Patient ID: Cindy Parrish, female    DOB: 09/12/1955, 60 y.o.   MRN: [id number]  HPI  60 yo H lady here for a pessary fitting. She has a Grade 3 cystocele.  Review of Systems     Objective:   Physical Exam WNWHHFNAD Breathing, conversing, and ambulating normally I tried a #3 and #4 ring pessaries but they were uncomfortable. A #2 would have fallen out. She was happy with a #2 cube       Assessment & Plan:  Grade cystocele- order #2 cube She will be notified when it is in and she will come in to make sure that she can remove and insert it.

## 2015-11-13 ENCOUNTER — Telehealth: Payer: Self-pay

## 2015-11-13 NOTE — Telephone Encounter (Signed)
Is thsi nausea? Or looes stools?  januvia only causes mild nausea in 2%  Of patients.  We can try decreasing to half a tab daily and see if can tolerate that. Have her try for a week and then call us back nad let us know.

## 2015-11-14 NOTE — Telephone Encounter (Signed)
Pt said that it is just stomach discomfort, cramping at times. I gave her the recommendation of trying 1/2 tab daily and to let us know if it is tolerated.

## 2015-11-25 ENCOUNTER — Encounter: Payer: Self-pay | Admitting: Obstetrics & Gynecology

## 2015-11-25 ENCOUNTER — Ambulatory Visit (INDEPENDENT_AMBULATORY_CARE_PROVIDER_SITE_OTHER): Payer: BLUE CROSS/BLUE SHIELD | Admitting: Obstetrics & Gynecology

## 2015-11-25 VITALS — BP 138/84 | HR 88 | Resp 16 | Ht 60.0 in | Wt 130.0 lb

## 2015-11-25 DIAGNOSIS — N8110 Cystocele, unspecified: Secondary | ICD-10-CM | POA: Diagnosis not present

## 2015-11-25 DIAGNOSIS — IMO0002 Reserved for concepts with insufficient information to code with codable children: Secondary | ICD-10-CM

## 2015-11-25 NOTE — Progress Notes (Signed)
   Subjective:    Patient ID: Cindy Parrish, female    DOB: Jan 27, 1956, 60 y.o.   MRN: [id number]  HPI This S H 60 yo lady is here to have her #2 cube pessary inserted.   Review of Systems     Objective:   Physical Exam She was able to remove and replace it without difficulty.      Assessment & Plan:  Cystocele- #2 cube pessary Rec remove it Monday and Thursday nights RTC 1 month/prn sooner

## 2015-12-05 ENCOUNTER — Other Ambulatory Visit: Payer: Self-pay | Admitting: Otolaryngology

## 2015-12-05 DIAGNOSIS — IMO0001 Reserved for inherently not codable concepts without codable children: Secondary | ICD-10-CM

## 2015-12-05 DIAGNOSIS — R519 Headache, unspecified: Secondary | ICD-10-CM

## 2015-12-05 DIAGNOSIS — R51 Headache: Secondary | ICD-10-CM

## 2015-12-05 DIAGNOSIS — R42 Dizziness and giddiness: Secondary | ICD-10-CM

## 2015-12-05 DIAGNOSIS — H918X2 Other specified hearing loss, left ear: Secondary | ICD-10-CM

## 2015-12-09 ENCOUNTER — Encounter: Payer: Self-pay | Admitting: Obstetrics & Gynecology

## 2015-12-09 ENCOUNTER — Ambulatory Visit (INDEPENDENT_AMBULATORY_CARE_PROVIDER_SITE_OTHER): Payer: BLUE CROSS/BLUE SHIELD | Admitting: Obstetrics & Gynecology

## 2015-12-09 VITALS — BP 134/84 | HR 85 | Resp 16 | Ht 61.0 in | Wt 130.0 lb

## 2015-12-09 DIAGNOSIS — N816 Rectocele: Secondary | ICD-10-CM | POA: Diagnosis not present

## 2015-12-09 DIAGNOSIS — N8110 Cystocele, unspecified: Secondary | ICD-10-CM

## 2015-12-09 DIAGNOSIS — IMO0002 Reserved for concepts with insufficient information to code with codable children: Secondary | ICD-10-CM

## 2015-12-09 NOTE — Progress Notes (Signed)
   Subjective:    Patient ID: Cindy Parrish, female    DOB: 02/10/1956, 60 y.o.   MRN: [id number]  HPI 60 yo H lady is here because the string on her cube pessary broke last week. She has not been entirely happy with the pessay, feels a lot of pressure when removing it.   Review of Systems She is not sexually active    Objective:   Physical Exam  WNWHSpanish-speaking lady (interpretor present for visit) I removed her pessary with a single tooth tenaculum Grade 2/3 cystocele Grade 1 rectocele Her uterus is very high with no prolapse.      Assessment & Plan:  Cystocele/mild rectocele She will speak with her daughter. I offered a cystocele and rectocele repair. RTC prn

## 2015-12-16 ENCOUNTER — Other Ambulatory Visit: Payer: BLUE CROSS/BLUE SHIELD

## 2015-12-16 ENCOUNTER — Ambulatory Visit: Payer: BLUE CROSS/BLUE SHIELD

## 2015-12-24 ENCOUNTER — Ambulatory Visit: Payer: BLUE CROSS/BLUE SHIELD | Admitting: Obstetrics & Gynecology

## 2016-02-04 ENCOUNTER — Ambulatory Visit
Admission: RE | Admit: 2016-02-04 | Discharge: 2016-02-04 | Disposition: A | Discharge status: Home / Self Care | Payer: BLUE CROSS/BLUE SHIELD | Source: Ambulatory Visit | Attending: Otolaryngology | Admitting: Otolaryngology

## 2016-02-04 DIAGNOSIS — H918X2 Other specified hearing loss, left ear: Secondary | ICD-10-CM

## 2016-02-04 DIAGNOSIS — R51 Headache: Secondary | ICD-10-CM

## 2016-02-04 DIAGNOSIS — R42 Dizziness and giddiness: Secondary | ICD-10-CM

## 2016-02-04 DIAGNOSIS — IMO0001 Reserved for inherently not codable concepts without codable children: Secondary | ICD-10-CM

## 2016-02-04 DIAGNOSIS — R519 Headache, unspecified: Secondary | ICD-10-CM

## 2016-02-04 MED ORDER — GADOBENATE DIMEGLUMINE 529 MG/ML IV SOLN
10.0000 mL | Freq: Once | INTRAVENOUS | Status: AC | PRN
  Administered 2016-02-04: 10 mL via INTRAVENOUS

## 2016-03-03 ENCOUNTER — Telehealth: Payer: Self-pay | Admitting: Family Medicine

## 2016-03-03 NOTE — Telephone Encounter (Signed)
She is welcome to see an endocrinologist if she would prefer. This is someone who speciliazes in diabetes.  If she felt the diabetes medicine I put her on made her sugars worse then she should have called. She hasn't even been in our office in 5 months so there is absolutely no way I would know unless she contacts our office. Patient is very resitant to medications and has tried and had side effects with multiple meds in the past with other providers.  I had started her on Januvia in March.

## 2016-03-03 NOTE — Telephone Encounter (Signed)
Pt daughter called to schedule an apt with Dr. Linford ArnoldMetheney for Friday Aug. 18th  [facility]. We scheduled the apt and then the Daughter called back and stated she wanted he mother to see Dr. Denyse Amassorey instead but her mother has never seen Denyse Amassorey. I told the daughter that we dont like to put patients with other Dr's if there PCP has an opening. She then asked if I could find a day metheney is booked and then put her with corey. The daughter stated that Eppie GibsonMetheny gave her a medicine that made her diabetes worse and I offered her to switch PCP's and she stated I was making it harder than it needed to be. I told her I was following policy. Then the daughters phone hung up .

## 2016-03-04 NOTE — Telephone Encounter (Signed)
Spoke w/pt's daughter and advised her of Dr. Shelah LewandowskyMetheney's recommendations. She stated that there was some miscommunication yesterday with making her mother's appt. She does want to see Dr. Linford ArnoldMetheney she [facility] and [facility] something for dizziness and she is feeling weak. I advised her daughter that she sill need a f/u and will need 30 mins to be seen. appt made that accomodates her schedule.Loralee PacasBarkley, Saliha Salts Over[place]etta

## 2016-03-05 ENCOUNTER — Ambulatory Visit: Payer: BLUE CROSS/BLUE SHIELD | Admitting: Family Medicine

## 2016-03-05 ENCOUNTER — Encounter: Payer: Self-pay | Admitting: Physician Assistant

## 2016-03-05 ENCOUNTER — Ambulatory Visit (INDEPENDENT_AMBULATORY_CARE_PROVIDER_SITE_OTHER): Payer: BLUE CROSS/BLUE SHIELD | Admitting: Physician Assistant

## 2016-03-05 VITALS — BP 146/60 | HR 64 | Ht 61.0 in | Wt 126.0 lb

## 2016-03-05 DIAGNOSIS — G4700 Insomnia, unspecified: Secondary | ICD-10-CM

## 2016-03-05 DIAGNOSIS — E119 Type 2 diabetes mellitus without complications: Secondary | ICD-10-CM

## 2016-03-05 DIAGNOSIS — I10 Essential (primary) hypertension: Secondary | ICD-10-CM | POA: Diagnosis not present

## 2016-03-05 DIAGNOSIS — R5383 Other fatigue: Secondary | ICD-10-CM

## 2016-03-05 DIAGNOSIS — M25512 Pain in left shoulder: Secondary | ICD-10-CM | POA: Diagnosis not present

## 2016-03-05 DIAGNOSIS — H6981 Other specified disorders of Eustachian tube, right ear: Secondary | ICD-10-CM

## 2016-03-05 DIAGNOSIS — Z13220 Encounter for screening for lipoid disorders: Secondary | ICD-10-CM

## 2016-03-05 LAB — COMPREHENSIVE METABOLIC PANEL
ALBUMIN: 4.6 g/dL (ref 3.6–5.1)
ALK PHOS: 65 U/L (ref 33–130)
ALT: 25 U/L (ref 6–29)
AST: 23 U/L (ref 10–35)
BUN: 11 mg/dL (ref 7–25)
CO2: 28 mmol/L (ref 20–31)
CREATININE: 0.59 mg/dL (ref 0.50–0.99)
Calcium: 9.8 mg/dL (ref 8.6–10.4)
Chloride: 102 mmol/L (ref 98–110)
Glucose, Bld: 131 mg/dL — ABNORMAL HIGH (ref 65–99)
Potassium: 4.6 mmol/L (ref 3.5–5.3)
SODIUM: 139 mmol/L (ref 135–146)
TOTAL PROTEIN: 7.7 g/dL (ref 6.1–8.1)
Total Bilirubin: 1.5 mg/dL — ABNORMAL HIGH (ref 0.2–1.2)

## 2016-03-05 LAB — CBC
HCT: 40.8 % (ref 35.0–45.0)
HEMOGLOBIN: 13.6 g/dL (ref 11.7–15.5)
MCH: 28.8 pg (ref 27.0–33.0)
MCHC: 33.3 g/dL (ref 32.0–36.0)
MCV: 86.4 fL (ref 80.0–100.0)
MPV: 9.2 fL (ref 7.5–12.5)
PLATELETS: 262 10*3/uL (ref 140–400)
RBC: 4.72 MIL/uL (ref 3.80–5.10)
RDW: 13.6 % (ref 11.0–15.0)
WBC: 5.6 10*3/uL (ref 3.8–10.8)

## 2016-03-05 LAB — LIPID PANEL
CHOL/HDL RATIO: 4.8 ratio (ref <=5.0)
CHOLESTEROL: 214 mg/dL — AB (ref 125–200)
HDL: 45 mg/dL — AB (ref >=46)
LDL Cholesterol: 133 mg/dL — ABNORMAL HIGH (ref <130)
Triglycerides: 178 mg/dL — ABNORMAL HIGH (ref <150)
VLDL: 36 mg/dL — ABNORMAL HIGH (ref <30)

## 2016-03-05 LAB — VITAMIN B12: Vitamin B-12: 612 pg/mL (ref 200–1100)

## 2016-03-05 LAB — FERRITIN: FERRITIN: 79 ng/mL (ref 20–288)

## 2016-03-05 LAB — TSH: TSH: 3 mIU/L

## 2016-03-05 LAB — C-REACTIVE PROTEIN

## 2016-03-05 LAB — POCT GLYCOSYLATED HEMOGLOBIN (HGB A1C): HEMOGLOBIN A1C: 8.5

## 2016-03-05 MED ORDER — METFORMIN HCL 1000 MG PO TABS: 1000.0000 mg | ORAL_TABLET | Freq: Two times a day (BID) | ORAL | 0 refills | Status: AC

## 2016-03-05 MED ORDER — PIOGLITAZONE HCL 15 MG PO TABS
15.0000 mg | ORAL_TABLET | Freq: Every day | ORAL | 2 refills | Status: AC
Start: 2016-03-05 — End: ?

## 2016-03-05 NOTE — Progress Notes (Signed)
   Subjective:    Patient ID: Canary BrimMaria Kreps, female    DOB: 1956/05/10, 60 y.o.   MRN: [id number]  HPI  Patient is a 60 year old hispanic female that only speaks spanish who presents to the clinic for 3 month diabetic follow-up. We are using an [employer] translater. She is only on metformin. She states that [place] upsets her stomach. She has been increasing her walking and reducing her carbohydrates. She is checking her sugars at night and ranging from 130 to 170. She denies any hypoglycemic events. She denies any open sores or wounds. She denies any vision changes or neuropathy in extremities.  She was seen in urgent care on 02/27/2016 for eustachian tube dysfunction and benign positional vertigo. She was given Antivert and Flonase she used. She states her dizziness has improved but not resolved. She is still having some right ear itchiness. She has been using the Flonase but not Antivert. She denies any fever, chills, cough, sore throat, sinus pressure.  She does feel more fatigued than usual over the past few months. She admits she is not sleeping well. She has not taken anything to help her fall asleep.    Review of Systems See HPI.     Objective:   Physical Exam  Constitutional: She is oriented to person, place, and time. She appears well-developed and well-nourished.  HENT:  Head: Normocephalic and atraumatic.  Right Ear: External ear normal.  Left Ear: External ear normal.  Nose: Nose normal.  Mouth/Throat: Oropharynx is clear and moist. No oropharyngeal exudate.  Eyes: Conjunctivae are normal. Right eye exhibits no discharge. Left eye exhibits no discharge.  Cardiovascular: Normal rate, regular rhythm and normal heart sounds.   Pulmonary/Chest: Effort normal and breath sounds normal.  Neurological: She is alert and oriented to person, place, and time.  Psychiatric: She has a normal mood and affect. Her behavior is normal.          Assessment & Plan:  DM, type II- A!C is 8.5.  Down from 10.5 in December 2016. Discussed with patient checking her sugars in morning with goal fasting sugar of 100-120.  She stated januiva upset her stomach and did not want to take.  Refilled metformin. Added Actos 15mg  daily.    Benign positional vertigo/ETD, right- follow up from ED. Improved but not resolved. Seems to be worse to the right. Offered prednisone but concerned about increasing her sugars. Continue flonase. Gave epley manuevers. If not improving may need PT to help with vertigo. Consider starting Zyrtec.   HTN- not at goal. Continue metroprolol. Follow up with PCP in 1 month to recheck if remaining elevated may need to add or increase medication. Discussed low salt diet.   Insomnia- not sleeping could be contributing to fatigue. Discussed melatonin to start before bedtime.   Left shoulder pain- ROM decreased. Appears to be adhesive capsulitis. Due to time issues not able to fully evaluate and treat. I did give handout. Follow up with PCP for further discussion.   No energy- labs ordered to look for metabolic cause. Follow up with PCP.   We started using electronic interpreter but there were some difficulties and we had to call son to complete office visit.

## 2016-03-05 NOTE — Patient Instructions (Addendum)
ACTOS once a day with metformin twice a day.   Start epley maneuvers for dizziness.  Continue flonase 2 sprays each nostril.  Zyrtec  at bedtime.   Melatonin 3mg -10mg  1 hour before bed to help with sleep.   Vrtigo posicional benigno (Benign Positional Vertigo) El vrtigo es la sensacin de que usted o todo lo que lo rodea se mueven cuando en realidad eso no sucede. El vrtigo posicional benigno es el tipo de vrtigo ms comn. La causa de este trastorno no es grave (es benigna). Algunos movimientos y determinadas posiciones pueden desencadenar el trastorno (es posicional). El vrtigo posicional benigno puede ser peligroso si ocurre mientras est haciendo algo que podra suponer un riesgo para usted y para los dems, por ejemplo, conduciendo un automvil.  CAUSAS En muchos de los [place], se desconoce la causa de este trastorno. Puede deberse a Hotel manageruna alteracin en una zona del odo interno que ayuda al cerebro a percibir el movimiento y a Administrator, Civil Servicecoordinar el equilibrio. Esta alteracin puede deberse a una infeccin viral (laberintitis), a una lesin en la cabeza o a los movimientos reiterados. FACTORES DE RIESGO Es ms probable que esta afeccin se manifieste en:  Las mujeres.  Las Smith Internationalpersonas mayores de [id number]. SNTOMAS Generalmente, los sntomas de este trastorno se presentan al mover la cabeza o los ojos en diferentes direcciones. Pueden aparecer repentinamente y suelen durar menos de un minuto. Entre los sntomas se pueden incluir los siguientes:  Prdida del equilibrio y cadas.  Sensacin de estar dando vueltas o movindose.  Sensacin de que el entorno est dando vueltas o movindose.  Nuseas y vmitos.  Visin borrosa.  Mareos.  Movimientos oculares involuntarios (nistagmo). Los sntomas pueden ser leves y algo fastidiosos, o pueden ser graves e interferir en la vida cotidiana. Los episodios de vrtigo posicional benigno pueden repetirse (ser recurrentes) a lo largo del Five Pointstiempo, y  algunos movimientos pueden desencadenarlos. Los sntomas pueden mejorar con Museum/gallery conservatorel tiempo. DIAGNSTICO Generalmente, este trastorno se diagnostica con una historia clnica y un examen fsico de la cabeza, el cuello y los odos. Tal vez lo deriven a Catering managerun mdico especialista en problemas de la garganta, la nariz y el odo (otorrinolaringlogo), o a uno que se especializa en trastornos del sistema nervioso (neurlogo). Pueden hacerle otros estudios, entre ellos:  Health visitoresonancia magntica.  Tomografa computarizada.  Estudios de los Ecolabmovimientos oculares. El mdico puede pedirle que cambie rpidamente de posicin mientras observa si se presentan sntomas de vrtigo posicional benigno, por ejemplo, nistagmo. Los movimientos oculares se pueden estudiar con una electronistagmografa (ENG), con estimulacin trmica, mediante la maniobra de Dix-Hallpike o con la prueba de rotacin.  Electroencefalograma (EEG). Este estudio registra la actividad elctrica del cerebro.  Pruebas de audicin. Lissa MoralesRATAMIENTO Generalmente, para tratar este trastorno, el mdico le har movimientos especficos con la cabeza para que el odo interno se normalice. Mohawk IndustriesCuando los casos son graves, tal vez haya que realizar una ciruga, pero esto no es frecuente. En algunos casos, el vrtigo posicional benigno se resuelve por s solo en el trmino de 2 o 4semanas. INSTRUCCIONES PARA EL CUIDADO EN EL HOGAR Seguridad  Muvase lentamente.No haga movimientos bruscos con el cuerpo o con la cabeza.  No conduzca.  No opere maquinaria pesada.  No haga ninguna tarea que podra ser peligrosa para usted o para Economistotras personas en caso de que ocurriera un episodio de vrtigo.  Si tiene dificultad para caminar o mantener el equilibrio, use un bastn para Photographermantener la estabilidad. Si se siente mareado o inestable, sintese de  inmediato.  Reanude sus actividades normales como se lo haya indicado el mdico. Pregntele al mdico qu actividades son seguras para  usted. Instrucciones generales  [employer] de venta libre y los recetados solamente como se lo haya indicado el mdico.  Evite algunas posiciones o determinados movimientos como se lo haya indicado el mdico.  Beba suficiente lquido para Pharmacologist la orina clara o de color amarillo plido.  Concurra a todas las visitas de control como se lo haya indicado el mdico. Esto es importante. SOLICITE ATENCIN MDICA SI:  Lance Muss.  El trastorno Adams, o le aparecen sntomas nuevos.  Sus familiares o amigos advierten cambios en su comportamiento.  Las nuseas o los vmitos empeoran.  Tiene sensacin de hormigueo o de adormecimiento. SOLICITE ATENCIN MDICA DE INMEDIATO SI:  Tiene dificultad para hablar o para moverse.  Esta mareado todo [employer].  Se desmaya.  Tiene dolores de cabeza intensos.  Tiene debilidad en los brazos o las piernas.  Tiene cambios en la audicin o la visin.  Siente rigidez en el cuello.  Tiene sensibilidad a Statistician.   Esta informacin no tiene Theme park manager el consejo del mdico. Asegrese de hacerle al mdico cualquier pregunta que tenga.   Document Released: 10/21/2008 Document Revised: 03/26/2015 Elsevier Interactive Patient Education 2016 [employer].  Capsulitis adhesiva (Adhesive Capsulitis) La capsulitis adhesiva es la inflamacin de los tendones y los ligamentos que rodean la articulacin del hombro (cpsula del hombro). Esta afeccin causa rigidez en el hombro y dolor al [employer]. A la capsulitis adhesiva tambin se la conoce como hombro congelado. CAUSAS Esta afeccin puede ser causada por lo siguiente:  Una lesin en la articulacin del hombro.  Un esfuerzo con el hombro.  No mover el hombro durante un tiempo. Esto puede ocurrir si la persona sufri una lesin en el brazo o si tuvo el brazo en un cabestrillo.  Problemas de salud prolongados, por ejemplo:  Diabetes.  Problemas de  tiroides.  Cardiopata.  Ictus.  Artritis reumatoide.  Enfermedad pulmonar. En algunos casos, es posible que la causa no se conozca. FACTORES DE RIESGO Es ms probable que esta afeccin se manifieste en:  Las mujeres.  Las [employer] International de [place]. SNTOMAS Los sntomas de esta afeccin incluyen lo siguiente:  Surveyor, quantity hombro al mover el brazo. El dolor tambin puede presentarse al tocar partes del hombro. El dolor es ms intenso durante la noche o cuando descansa.  Molestia o dolor en el hombro.  Imposibilidad de mover el hombro normalmente.  Espasmos musculares. DIAGNSTICO Esta afeccin se diagnostica mediante un examen fsico y estudios de diagnstico por imgenes, como una radiografa o una resonancia magntica (RM). TRATAMIENTO El tratamiento de esta afeccin puede incluir lo siguiente:  Tratamiento de la causa o la afeccin preexistentes.  Fisioterapia. Incluye ejercicios para recuperar el movimiento del hombro.  Medicamentos. Se pueden administrar medicamentos para Engineer, materials y reducir la inflamacin o los espasmos musculares.  Inyecciones de corticoides en la articulacin del hombro.  Manipulacin del hombro. Este es un procedimiento que se realiza para Multimedia programmer la posicin del hombro. Para este procedimiento le administrarn un medicamento para hacerlo dormir (anestesia general). Tambin pueden inyectarle en la articulacin agua salada (solucin salina) a alta presin para romper las adherencias.  Ciruga. Se puede realizar [employer] Corporation graves cuando otros tratamientos no han sido eficaces. Aunque la Harley-Davidson de las personas se recuperan totalmente de la capsulitis [place], es posible que algunas no recuperen la movilidad total  del hombro. INSTRUCCIONES PARA EL CUIDADO EN EL HOGAR  Tome los medicamentos de venta libre y los recetados solamente como se lo haya indicado el mdico.  Si el tratamiento incluye la fisioterapia, siga las indicaciones del  fisioterapeuta.  Evite los ejercicios que sobreexigen el hombro, por ejemplo, los lanzamientos. Estos ejercicios pueden Armed forces training and education officer.  Si se lo indican, aplique hielo sobre la zona lesionada:  Ponga el hielo en una bolsa plstica.  Coloque una toalla entre la piel y la bolsa de hielo.  Coloque el hielo durante , 2 a 3veces por Futures trader. SOLICITE ATENCIN MDICA SI:  Presenta nuevos sntomas.  Los sntomas empeoran.   Esta informacin no tiene Theme park manager el consejo del mdico. Asegrese de hacerle al mdico cualquier pregunta que tenga.   Document Released: 10/30/2012 Document Revised: 03/26/2015 Elsevier Interactive Patient Education 2016 [employer].  Ejercicios de amplitud de movimiento del hombro (Youth worker of Motion Exercises) Los ejercicios de amplitud de movimiento tienen como fin mantener la libertad de movimiento del hombro. Se recomiendan con frecuencia a las Armed forces logistics/support/administrative officer. EJERCICIO DE MOVIMIENTO Cuando pueda, haga este ejercicio de 5a 6das a la semana, o como se lo haya indicado el mdico. Vaya progresando [employer] 2 series de 10balanceos. Ejercicio del pndulo Cmo hacer este ejercicio recostado boca abajo 1. Recustese boca abajo sobre la cama con el abdomen cerca del borde de la cama. 2. Cuelgue el brazo sobre el borde de la Opp. 3. Relaje el hombro, el brazo y Engineer, site. 4. Balancee el brazo hacia adelante y [place] atrs en forma lenta y [place]. No use los msculos del cuello para balancear el brazo. Deben estar relajados. Si tiene dificultades para balancear el brazo, pdale a alguien que lo haga por usted, con mucho cuidado. Cuando haga este ejercicio por primera vez, balancee el brazo en un ngulo de 15grados durante 15segundos o balancee el brazo 10veces. Con el tiempo, a medida que el dolor disminuya, aumente el ngulo del Banker a 30 o 45grados. 5. Repita los pasos 1 a 4con el otro  brazo. Cmo hacer este ejercicio de pie 1. Prese junto a una mesa o una silla firme y apoye la mano encima.  Inclnese hacia adelante a la altura de la cintura.  Doble levemente las rodillas.  Relaje el otro brazo y deje que cuelgue flojo.  Relaje el omplato del brazo que cuelga y deje que caiga.  Siga manteniendo el hombro relajado y use el movimiento del cuerpo para balancear el brazo en pequeos crculos. La primera vez que haga este ejercicio, balancee el brazo durante 30segundos o 10veces. La prxima vez que lo haga, balancee el brazo durante un poco ms de [place].  Prese con la cabeza en alto y reljese.  Repita los pasos 1 a 7, pero esta vez cambie la direccin de los crculos. 2. Repita los pasos 1 a 8 con el otro brazo. EJERCICIOS DE ESTIRAMIENTO Haga estos ejercicios de 3 a 4veces al da, de 5 a 6veces a la semana, o como se lo haya indicado el mdico. Vaya progresando Stage manager el estiramiento durante 20segundos. Ejercicio de estiramiento1 1. Levante el brazo en lnea recta hacia adelante. 2. Doble el brazo con un ngulo de 90grados a la altura del codo (ngulo recto) de modo tal que el antebrazo cruce por delante del cuerpo y se vea como la letra "L". 3. Use el otro brazo para tirar suavemente del codo hacia adelante y  cruzar el cuerpo. 4. Repita los pasos 1 a 3 con el otro brazo. Ejercicio de estiramiento2 Para este ejercicio, necesitar una toalla o una soga. 1. Doble un brazo por la espalda con la palma hacia afuera. 2. Con la otra mano, sostenga una toalla. 3. Levante el brazo que sostiene la toalla por encima de la cabeza y doble el brazo a la altura del codo. La mueca debe quedar detrs del cuello. 4. Con la [place] libre, tome el extremo que cuelga de la toalla. 5. Con la mano que est ms Seychellesarriba, tire suavemente de la toalla [place]acia arriba. 6. Con la mano que est ms abajo, tire de la Occidental Petroleumtoalla hacia abajo. 7. Repita los pasos 1 a 6 con el otro  brazo. EJERCICIOS DE FORTALECIMIENTO Haga cada uno de estos ejercicios en cuatro momentos diferentes del da (sesiones) todos los das o como se lo haya indicado el mdico. Para comenzar, repita cada ejercicio 5veces (repeticiones). Vaya progresando hasta hacer 3series de 12repeticiones o como se lo haya indicado el mdico. Ejercicio de fortalecimiento1 Para esta actividad, necesitar una pesa liviana. A medida que tenga ms fuerza, podr usar una ms pesada. 1. Con una pesa en la mano, de pie, levante el brazo en lnea recta hacia el costado del cuerpo hasta que est a la misma altura que el hombro. 2. Doble el brazo a 90grados de modo tal que los dedos apunten USG Corporationhacia el techo. 3. Levante la mano lentamente hasta que el brazo quede estirado [place] arriba. 4. Repita los pasos 1 a 3 con el otro brazo. Ejercicio de fortalecimiento2 Para esta actividad, necesitar una pesa liviana. A medida que tenga ms fuerza, podr usar una ms pesada. 1. Con una pesa en la mano, de pie, estire el brazo y Loews Corporationmuvalo gradualmente hasta formar un arco, primero hacia el costado, luego hacia el frente y despus por encima de la cabeza. 2. Mueva gradualmente el otro brazo Whole Foodshasta formar un arco, primero hacia el costado, luego hacia el frente y despus por encima de la cabeza. 3. Repita los pasos 1 a 2 con el otro brazo. Ejercicio de fortalecimiento3 Para esta actividad, necesitar Radio produceruna banda elstica. A medida que tenga ms fuerza, aumente gradualmente el tamao de las bandas o la cantidad de bandas que [place]ausa a la vez. 1. Liberty GlobalDe pie, sostenga una banda elstica con Neomia Dearuna mano y levante ese brazo en lnea recta Normajean Glasgowhacia arriba. 2. Con la otra mano, tire la banda hacia abajo hasta que esa mano le quede al costado del cuerpo. 3. Repita los pasos 1 a 2 con el otro brazo.   Esta informacin no tiene Theme park managercomo fin reemplazar el consejo del mdico. Asegrese de hacerle al mdico cualquier pregunta que tenga.   Document Released:  04/25/2013 Document Revised: 11/19/2014 Elsevier Interactive Patient Education [employer]2016 Elsevier Inc.

## 2016-03-06 LAB — VITAMIN D 25 HYDROXY (VIT D DEFICIENCY, FRACTURES): Vit D, 25-Hydroxy: 18 ng/mL — ABNORMAL LOW (ref 30–100)

## 2016-03-06 LAB — SEDIMENTATION RATE: Sed Rate: 16 mm/hr (ref 0–30)

## 2016-03-08 ENCOUNTER — Ambulatory Visit: Payer: BLUE CROSS/BLUE SHIELD | Admitting: Family Medicine

## 2016-03-08 ENCOUNTER — Encounter: Payer: Self-pay | Admitting: Physician Assistant

## 2016-03-08 DIAGNOSIS — E1169 Type 2 diabetes mellitus with other specified complication: Secondary | ICD-10-CM | POA: Insufficient documentation

## 2016-03-08 DIAGNOSIS — E785 Hyperlipidemia, unspecified: Secondary | ICD-10-CM | POA: Insufficient documentation

## 2016-03-09 ENCOUNTER — Other Ambulatory Visit: Payer: Self-pay

## 2016-03-09 DIAGNOSIS — M25512 Pain in left shoulder: Secondary | ICD-10-CM | POA: Insufficient documentation

## 2016-03-09 DIAGNOSIS — G4700 Insomnia, unspecified: Secondary | ICD-10-CM | POA: Insufficient documentation

## 2016-03-09 DIAGNOSIS — H6980 Other specified disorders of Eustachian tube, unspecified ear: Secondary | ICD-10-CM | POA: Insufficient documentation

## 2016-03-09 DIAGNOSIS — R5383 Other fatigue: Secondary | ICD-10-CM | POA: Insufficient documentation

## 2016-03-10 ENCOUNTER — Other Ambulatory Visit: Payer: Self-pay

## 2016-03-10 MED ORDER — VITAMIN D (ERGOCALCIFEROL) 1.25 MG (50000 UNIT) PO CAPS
50000.0000 [IU] | ORAL_CAPSULE | ORAL | 0 refills | Status: AC
Start: 2016-03-10 — End: ?

## 2016-03-16 ENCOUNTER — Ambulatory Visit: Payer: BLUE CROSS/BLUE SHIELD | Admitting: Family Medicine

## 2016-06-14 ENCOUNTER — Other Ambulatory Visit: Payer: Self-pay | Admitting: Internal Medicine

## 2016-06-14 DIAGNOSIS — Z1231 Encounter for screening mammogram for malignant neoplasm of breast: Secondary | ICD-10-CM

## 2016-07-16 ENCOUNTER — Ambulatory Visit (INDEPENDENT_AMBULATORY_CARE_PROVIDER_SITE_OTHER): Payer: BLUE CROSS/BLUE SHIELD

## 2016-07-16 DIAGNOSIS — Z1231 Encounter for screening mammogram for malignant neoplasm of breast: Secondary | ICD-10-CM | POA: Diagnosis not present

## 2016-08-26 ENCOUNTER — Telehealth: Payer: Self-pay | Admitting: Family Medicine

## 2016-08-26 NOTE — Telephone Encounter (Signed)
Pt decline getting flu shot.

## 2016-10-05 ENCOUNTER — Other Ambulatory Visit: Payer: Self-pay | Admitting: Internal Medicine

## 2016-10-08 ENCOUNTER — Other Ambulatory Visit: Payer: Self-pay | Admitting: Internal Medicine

## 2016-10-08 DIAGNOSIS — R1031 Right lower quadrant pain: Secondary | ICD-10-CM

## 2016-10-19 ENCOUNTER — Ambulatory Visit
Admission: RE | Admit: 2016-10-19 | Discharge: 2016-10-19 | Disposition: A | Discharge status: Home / Self Care | Payer: BLUE CROSS/BLUE SHIELD | Source: Ambulatory Visit | Attending: Internal Medicine | Admitting: Internal Medicine

## 2016-10-19 DIAGNOSIS — R1031 Right lower quadrant pain: Secondary | ICD-10-CM

## 2017-02-07 ENCOUNTER — Other Ambulatory Visit: Payer: Self-pay | Admitting: Obstetrics & Gynecology

## 2017-02-07 DIAGNOSIS — N8111 Cystocele, midline: Secondary | ICD-10-CM

## 2017-02-11 ENCOUNTER — Other Ambulatory Visit: Payer: BLUE CROSS/BLUE SHIELD

## 2017-02-11 ENCOUNTER — Ambulatory Visit
Admission: RE | Admit: 2017-02-11 | Discharge: 2017-02-11 | Disposition: A | Discharge status: Home / Self Care | Payer: BLUE CROSS/BLUE SHIELD | Source: Ambulatory Visit | Attending: Obstetrics & Gynecology | Admitting: Obstetrics & Gynecology

## 2017-02-11 DIAGNOSIS — N8111 Cystocele, midline: Secondary | ICD-10-CM

## 2017-03-31 ENCOUNTER — Other Ambulatory Visit: Payer: Self-pay | Admitting: Obstetrics & Gynecology

## 2017-03-31 DIAGNOSIS — N814 Uterovaginal prolapse, unspecified: Secondary | ICD-10-CM

## 2017-03-31 DIAGNOSIS — N8111 Cystocele, midline: Secondary | ICD-10-CM

## 2018-05-25 ENCOUNTER — Other Ambulatory Visit: Payer: Self-pay | Admitting: Physician Assistant

## 2018-05-25 DIAGNOSIS — Z0000 Encounter for general adult medical examination without abnormal findings: Secondary | ICD-10-CM

## 2018-05-26 ENCOUNTER — Ambulatory Visit (INDEPENDENT_AMBULATORY_CARE_PROVIDER_SITE_OTHER): Payer: BLUE CROSS/BLUE SHIELD

## 2018-05-26 DIAGNOSIS — Z1231 Encounter for screening mammogram for malignant neoplasm of breast: Secondary | ICD-10-CM | POA: Diagnosis not present

## 2018-05-26 DIAGNOSIS — Z0000 Encounter for general adult medical examination without abnormal findings: Secondary | ICD-10-CM

## 2019-05-10 ENCOUNTER — Other Ambulatory Visit: Payer: Self-pay | Admitting: Physician Assistant

## 2019-05-10 DIAGNOSIS — Z1231 Encounter for screening mammogram for malignant neoplasm of breast: Secondary | ICD-10-CM

## 2019-05-23 ENCOUNTER — Other Ambulatory Visit: Payer: Self-pay | Admitting: Radiology

## 2019-05-23 ENCOUNTER — Other Ambulatory Visit: Payer: Self-pay | Admitting: Physician Assistant

## 2019-05-23 DIAGNOSIS — Z13820 Encounter for screening for osteoporosis: Secondary | ICD-10-CM

## 2019-06-20 ENCOUNTER — Other Ambulatory Visit: Payer: Self-pay

## 2019-06-20 ENCOUNTER — Ambulatory Visit: Payer: BLUE CROSS/BLUE SHIELD

## 2019-06-20 ENCOUNTER — Ambulatory Visit (INDEPENDENT_AMBULATORY_CARE_PROVIDER_SITE_OTHER): Payer: BC Managed Care – PPO

## 2019-06-20 DIAGNOSIS — Z1231 Encounter for screening mammogram for malignant neoplasm of breast: Secondary | ICD-10-CM

## 2019-07-11 ENCOUNTER — Ambulatory Visit (INDEPENDENT_AMBULATORY_CARE_PROVIDER_SITE_OTHER): Payer: BC Managed Care – PPO

## 2019-07-11 ENCOUNTER — Other Ambulatory Visit: Payer: Self-pay

## 2019-07-11 DIAGNOSIS — Z13820 Encounter for screening for osteoporosis: Secondary | ICD-10-CM | POA: Diagnosis not present

## 2021-04-21 ENCOUNTER — Other Ambulatory Visit: Payer: Self-pay | Admitting: Physician Assistant

## 2021-04-21 DIAGNOSIS — Z1231 Encounter for screening mammogram for malignant neoplasm of breast: Secondary | ICD-10-CM

## 2021-04-23 ENCOUNTER — Other Ambulatory Visit: Payer: Self-pay

## 2021-04-23 ENCOUNTER — Ambulatory Visit (INDEPENDENT_AMBULATORY_CARE_PROVIDER_SITE_OTHER): Payer: Medicare Other

## 2021-04-23 DIAGNOSIS — Z1231 Encounter for screening mammogram for malignant neoplasm of breast: Secondary | ICD-10-CM

## 2022-06-04 ENCOUNTER — Other Ambulatory Visit: Payer: Self-pay | Admitting: Physician Assistant

## 2022-06-04 DIAGNOSIS — Z1231 Encounter for screening mammogram for malignant neoplasm of breast: Secondary | ICD-10-CM

## 2022-06-09 ENCOUNTER — Encounter: Payer: Self-pay | Admitting: Physician Assistant

## 2022-06-09 ENCOUNTER — Ambulatory Visit (INDEPENDENT_AMBULATORY_CARE_PROVIDER_SITE_OTHER): Payer: Medicare Other

## 2022-06-09 DIAGNOSIS — Z1231 Encounter for screening mammogram for malignant neoplasm of breast: Secondary | ICD-10-CM

## 2023-08-30 IMAGING — MG MM DIGITAL SCREENING BILAT W/ TOMO AND CAD
8 series · 9 of 24 positions shown · non-contrast
Comparison: Previous exam(s).

CLINICAL DATA: Screening.

EXAM:
DIGITAL SCREENING BILATERAL MAMMOGRAM WITH TOMOSYNTHESIS AND CAD
TECHNIQUE: Bilateral screening digital craniocaudal and mediolateral oblique
mammograms were obtained. Bilateral screening digital breast
tomosynthesis was performed. The images were evaluated with
computer-aided detection.

[L MLO synth-2D]
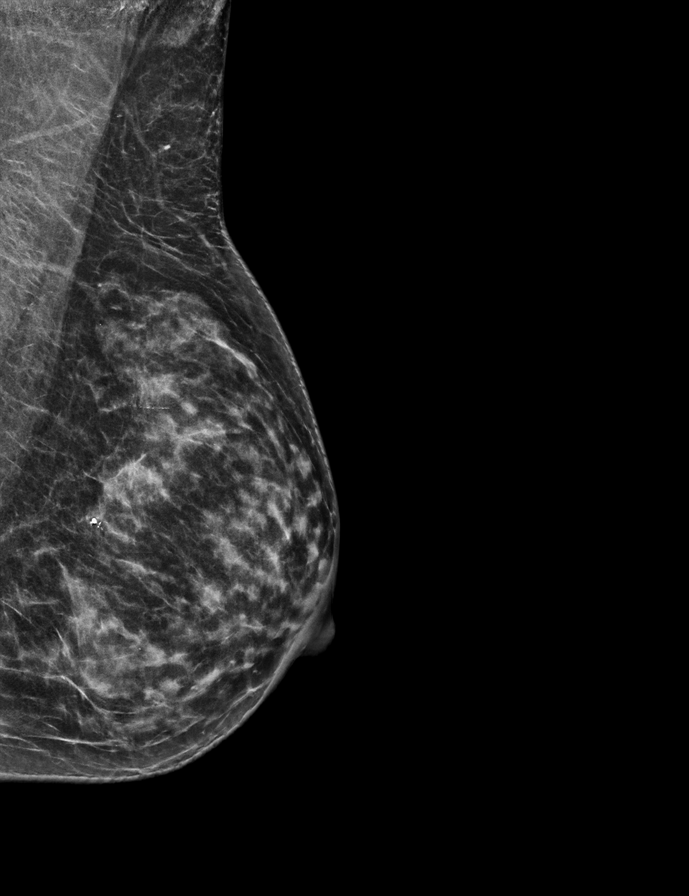

[L CC synth-2D]
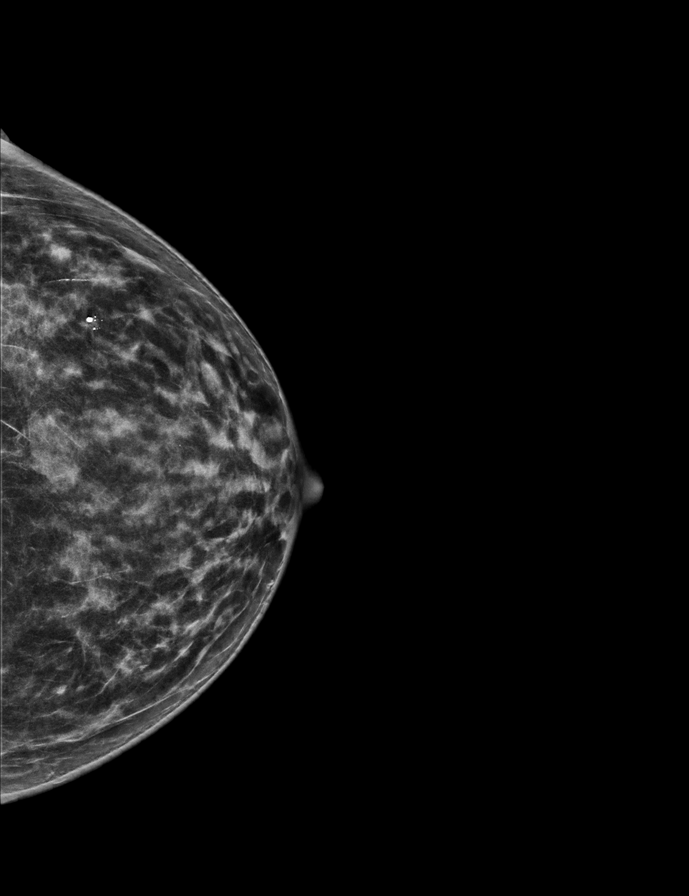

[R CC synth-2D]
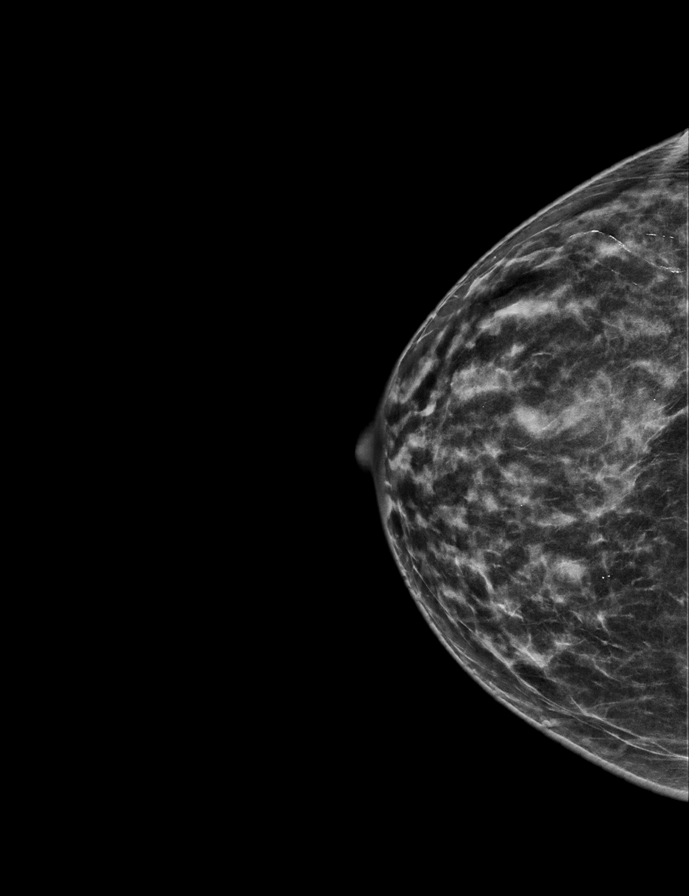

[R MLO synth-2D]
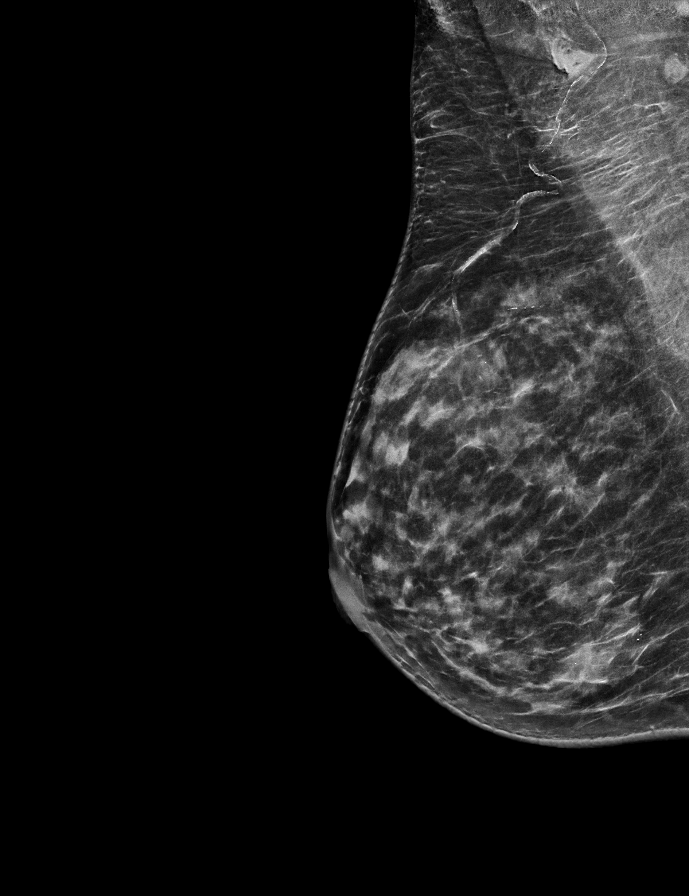

[L CC tomo · 2 of 56 frames shown]
[frame 19/56]
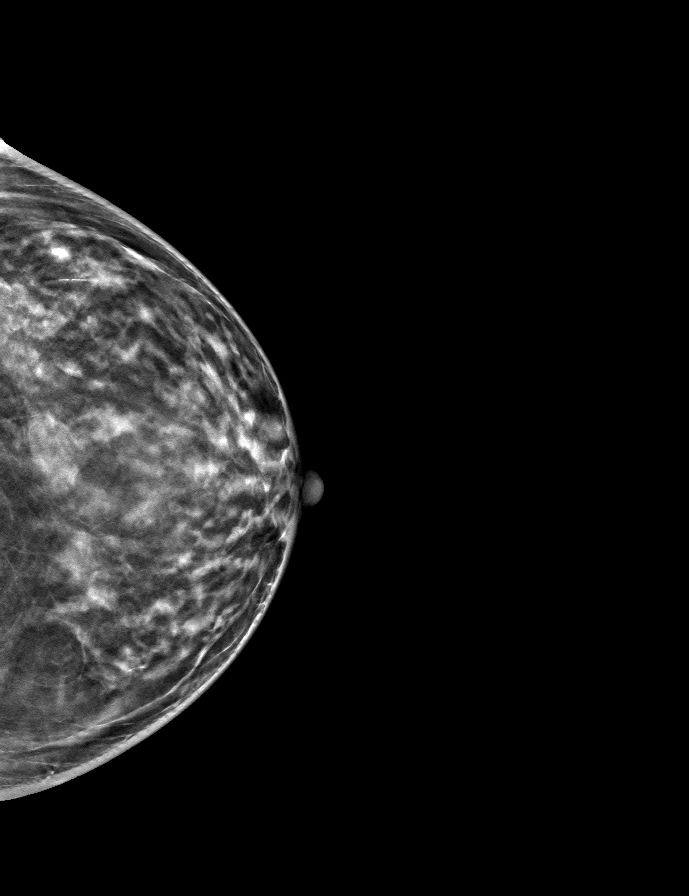
[frame 29/56]
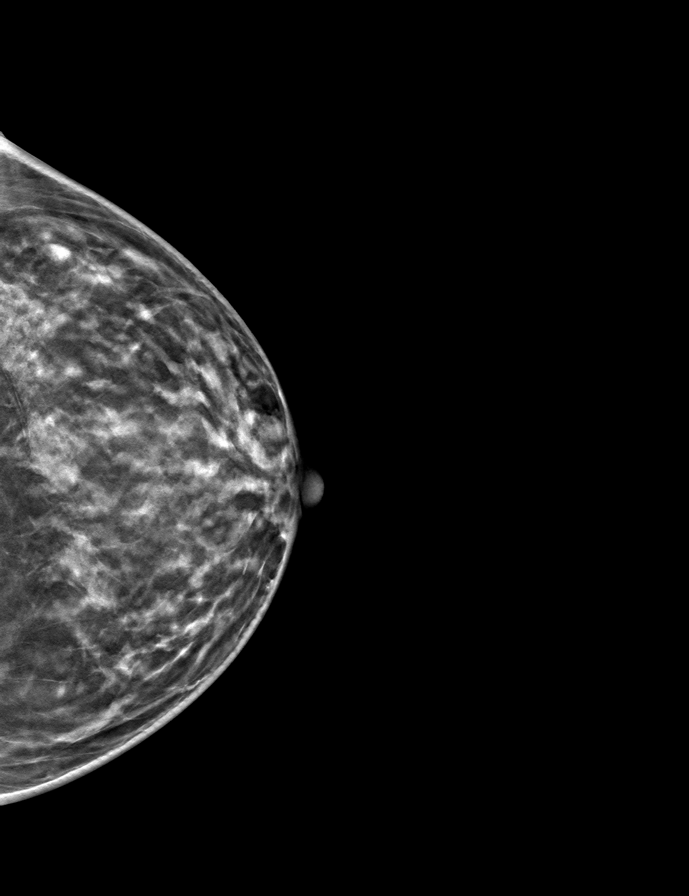

[L MLO tomo · tomo slice 29/57.0]
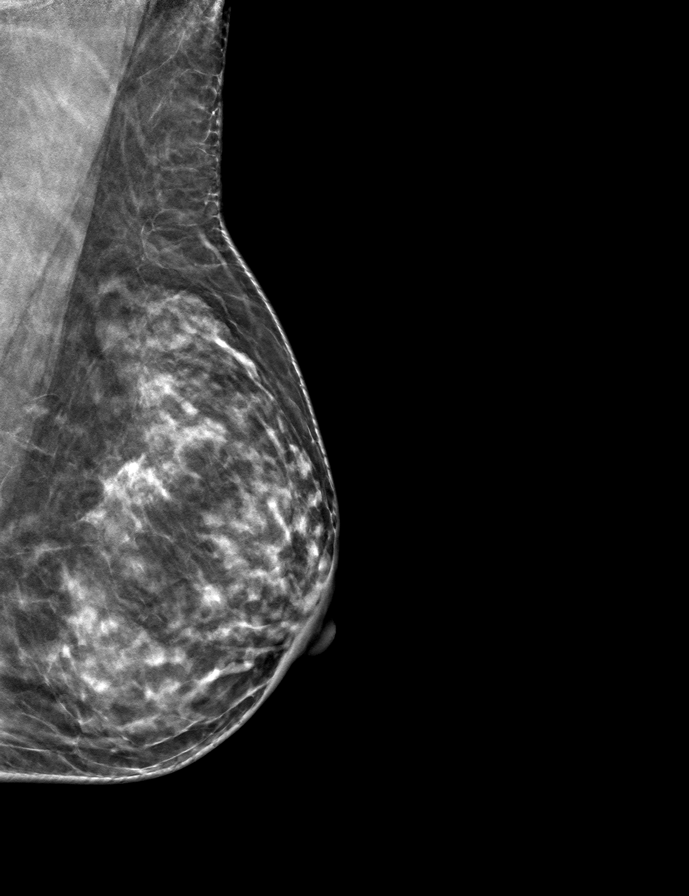

[R MLO tomo · tomo slice 31/60.0]
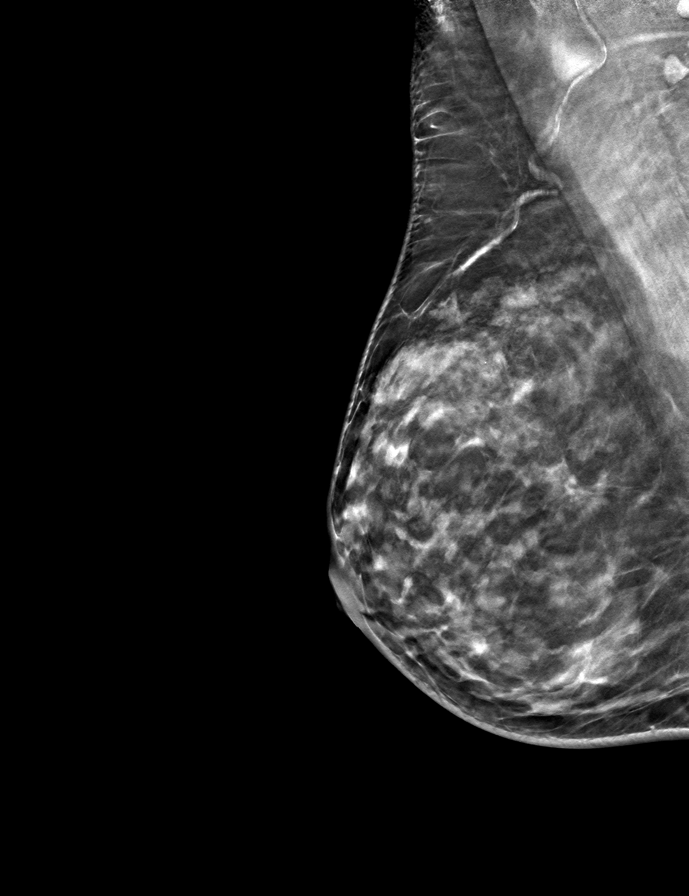

[R CC tomo · tomo slice 29/57.0]
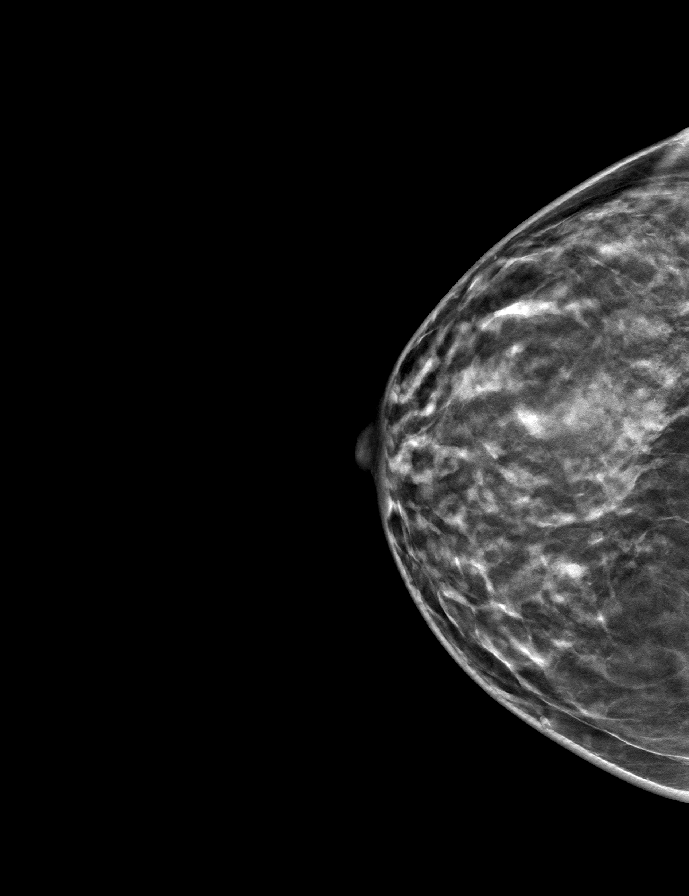

[9 of 24 positions shown; findings below may reference images not displayed]

ACR Breast Density Category c: The breast tissue is heterogeneously
dense, which may obscure small masses.
FINDINGS: There are no findings suspicious for malignancy.
IMPRESSION: No mammographic evidence of malignancy. A result letter of this
screening mammogram will be mailed directly to the patient.

RECOMMENDATION:
Screening mammogram in one year. (Code:[id number]-W-BC3)

BI-RADS CATEGORY  1: Negative.

## 2024-01-25 ENCOUNTER — Other Ambulatory Visit: Payer: Self-pay | Admitting: Physician Assistant

## 2024-01-25 DIAGNOSIS — Z1231 Encounter for screening mammogram for malignant neoplasm of breast: Secondary | ICD-10-CM

## 2024-01-25 DIAGNOSIS — Z780 Asymptomatic menopausal state: Secondary | ICD-10-CM
